# Patient Record
Sex: Male | Born: 2000 | Race: Black or African American | Hispanic: No | Smoking: Never smoker
Health system: Southern US, Community
[De-identification: ages and names within clinical notes are randomized; demographics above are authoritative.]

## PROBLEM LIST (undated history)

## (undated) DIAGNOSIS — J45909 Unspecified asthma, uncomplicated: Secondary | ICD-10-CM

---
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
## 2018-01-07 ENCOUNTER — Emergency Department (HOSPITAL_COMMUNITY): Payer: Medicaid Other

## 2018-01-07 ENCOUNTER — Other Ambulatory Visit: Payer: Self-pay

## 2018-01-07 ENCOUNTER — Emergency Department (HOSPITAL_COMMUNITY)
Admission: EM | Admit: 2018-01-07 | Discharge: 2018-01-07 | Disposition: A | Discharge status: Home / Self Care | Payer: Medicaid Other | Attending: Physician Assistant | Admitting: Physician Assistant

## 2018-01-07 ENCOUNTER — Encounter (HOSPITAL_COMMUNITY): Payer: Self-pay | Admitting: Emergency Medicine

## 2018-01-07 DIAGNOSIS — R1031 Right lower quadrant pain: Secondary | ICD-10-CM | POA: Insufficient documentation

## 2018-01-07 DIAGNOSIS — R1110 Vomiting, unspecified: Secondary | ICD-10-CM | POA: Diagnosis not present

## 2018-01-07 DIAGNOSIS — R109 Unspecified abdominal pain: Secondary | ICD-10-CM

## 2018-01-07 LAB — URINALYSIS, ROUTINE W REFLEX MICROSCOPIC
BILIRUBIN URINE: NEGATIVE
Bacteria, UA: NONE SEEN
GLUCOSE, UA: NEGATIVE mg/dL
KETONES UR: NEGATIVE mg/dL
LEUKOCYTES UA: NEGATIVE
NITRITE: NEGATIVE
PROTEIN: NEGATIVE mg/dL
Specific Gravity, Urine: 1.019 (ref 1.005–1.030)
pH: 5 (ref 5.0–8.0)

## 2018-01-07 LAB — COMPREHENSIVE METABOLIC PANEL
ALBUMIN: 4.1 g/dL (ref 3.5–5.0)
ALT: 39 U/L (ref 17–63)
ANION GAP: 10 (ref 5–15)
AST: 32 U/L (ref 15–41)
Alkaline Phosphatase: 92 U/L (ref 38–126)
BUN: 10 mg/dL (ref 6–20)
CHLORIDE: 106 mmol/L (ref 101–111)
CO2: 24 mmol/L (ref 22–32)
Calcium: 9.4 mg/dL (ref 8.9–10.3)
Creatinine, Ser: 1.06 mg/dL (ref 0.61–1.24)
GFR calc Af Amer: >60 mL/min (ref >60)
Glucose, Bld: 120 mg/dL — ABNORMAL HIGH (ref 65–99)
POTASSIUM: 3.5 mmol/L (ref 3.5–5.1)
Sodium: 140 mmol/L (ref 135–145)
TOTAL PROTEIN: 7.6 g/dL (ref 6.5–8.1)
Total Bilirubin: 0.6 mg/dL (ref 0.3–1.2)

## 2018-01-07 LAB — CBC
HEMATOCRIT: 40.7 % (ref 39.0–52.0)
HEMOGLOBIN: 14.1 g/dL (ref 13.0–17.0)
MCH: 30.9 pg (ref 26.0–34.0)
MCHC: 34.6 g/dL (ref 30.0–36.0)
MCV: 89.3 fL (ref 78.0–100.0)
Platelets: 332 10*3/uL (ref 150–400)
RBC: 4.56 MIL/uL (ref 4.22–5.81)
RDW: 12.2 % (ref 11.5–15.5)
WBC: 8.5 10*3/uL (ref 4.0–10.5)

## 2018-01-07 LAB — LIPASE, BLOOD: LIPASE: 20 U/L (ref 11–51)

## 2018-01-07 MED ORDER — IOPAMIDOL (ISOVUE-300) INJECTION 61%
INTRAVENOUS | Status: AC
  Administered 2018-01-07: 100 mL
  Filled 2018-01-07: qty 100

## 2018-01-07 MED ORDER — IBUPROFEN 800 MG PO TABS: 800.0000 mg | ORAL_TABLET | Freq: Three times a day (TID) | ORAL | 0 refills | Status: DC | PRN

## 2018-01-07 MED ORDER — DICYCLOMINE HCL 20 MG PO TABS: 20.0000 mg | ORAL_TABLET | Freq: Three times a day (TID) | ORAL | 0 refills | Status: DC | PRN

## 2018-01-07 MED ORDER — KETOROLAC TROMETHAMINE 15 MG/ML IJ SOLN
15.0000 mg | Freq: Once | INTRAMUSCULAR | Status: AC
  Administered 2018-01-07: 15 mg via INTRAVENOUS
  Filled 2018-01-07: qty 1

## 2018-01-07 MED ORDER — FENTANYL CITRATE (PF) 100 MCG/2ML IJ SOLN
50.0000 ug | Freq: Once | INTRAMUSCULAR | Status: AC
  Administered 2018-01-07: 50 ug via INTRAVENOUS
  Filled 2018-01-07: qty 2

## 2018-01-07 NOTE — ED Triage Notes (Signed)
Patient woke up this morning with right lateral abdominal pain with emesis x1 , no diarrhea , pt. added nasal congestion/sneezing and rhinorrhea x several days .

## 2018-01-07 NOTE — ED Provider Notes (Signed)
[facility] EMERGENCY DEPARTMENT Provider Note   CSN: [id number] Arrival date & time: 01/07/18  0536     History   Chief Complaint Chief Complaint  Patient presents with  . Abdominal Pain    Emesis  . Nasal Congestion    HPI [name] is a 17 y.o. male that significant past medical history who presents the emergency department complaining of right lower quadrant abdominal pain that woke him from sleep this morning at 0500.  Describes pain as a persistent throbbing in the right lower quadrant, it does not radiate.  States he had one episode of emesis this morning which was nonbloody, no nausea at present.  Patient states the pain has been constant, somewhat improved at this time.  Rates current pain a 5 out of 10 in severity, relayed it is worse with movement, walking, and with going over bumps in the car.  There are no alleviating factors, patient has not tried any interventions prior to arrival.  Denies fever, chills, diarrhea, constipation, blood in stool, dysuria, or testicular pain/swelling.  HPI  History reviewed. No pertinent past medical history.  There are no active problems to display for this patient.   History reviewed. No pertinent surgical history.     Home Medications    Prior to Admission medications   Medication Sig Start Date End Date Taking? Authorizing Provider  acetaminophen (TYLENOL) 325 MG tablet Take 650 mg by mouth every 6 (six) hours as needed for mild pain.     [provider]    Family History History reviewed. No pertinent family history.  Social History Social History   Tobacco Use  . Smoking status: Never Smoker  Substance Use Topics  . Alcohol use: No  . Drug use: No     Allergies   Patient has no known allergies.   Review of Systems Review of Systems  Constitutional: Negative for chills and fever.  Respiratory: Negative for shortness of breath.   Cardiovascular: Negative for chest pain.    Gastrointestinal: Positive for abdominal pain, nausea (resolved) and vomiting (1 episode). Negative for blood in stool, constipation and diarrhea.  Genitourinary: Negative for dysuria, frequency, hematuria, scrotal swelling and testicular pain.  All other systems reviewed and are negative.    Physical Exam Updated Vital Signs BP 106/74 (BP Location: Right Arm)   Pulse 65   Temp 98.1 F (36.7 C) (Oral)   Resp 16   Ht 6\' 1"  (1.854 m)   Wt 113.4 kg (250 lb)   SpO2 99%   BMI 32.98 kg/m   Physical Exam  Constitutional: He appears well-developed and well-nourished. No distress.  HENT:  Head: Normocephalic and atraumatic.  Eyes: Conjunctivae are normal. Right eye exhibits no discharge. Left eye exhibits no discharge.  Cardiovascular: Normal rate and regular rhythm.  No murmur heard. Pulmonary/Chest: Breath sounds normal. No respiratory distress. He has no wheezes. He has no rales.  Abdominal: Soft. He exhibits no distension. There is tenderness in the right lower quadrant and suprapubic area. There is no rigidity, no rebound, no guarding, no CVA tenderness and negative Murphy's sign.  Neurological: He is alert.  Clear speech.   Skin: Skin is warm and dry. No rash noted.  Psychiatric: He has a normal mood and affect. His behavior is normal.  Nursing note and vitals reviewed.   ED Treatments / Results  Labs Results for orders placed or performed during the hospital encounter of 01/07/18  Lipase, blood  Result Value Ref Range  Lipase 20 11 - 51 U/L  Comprehensive metabolic panel  Result Value Ref Range   Sodium 140 135 - 145 mmol/L   Potassium 3.5 3.5 - 5.1 mmol/L   Chloride 106 101 - 111 mmol/L   CO2 24 22 - 32 mmol/L   Glucose, Bld 120 (H) 65 - 99 mg/dL   BUN 10 6 - 20 mg/dL   Creatinine, Ser 9.141.06 0.61 - 1.24 mg/dL   Calcium 9.4 8.9 - 78.210.3 mg/dL   Total Protein 7.6 6.5 - 8.1 g/dL   Albumin 4.1 3.5 - 5.0 g/dL   AST 32 15 - 41 U/L   ALT 39 17 - 63 U/L   Alkaline  Phosphatase 92 38 - 126 U/L   Total Bilirubin 0.6 0.3 - 1.2 mg/dL   GFR calc non Af Amer >60 >60 mL/min   GFR calc Af Amer >60 >60 mL/min   Anion gap 10 5 - 15  CBC  Result Value Ref Range   WBC 8.5 4.0 - 10.5 K/uL   RBC 4.56 4.22 - 5.81 MIL/uL   Hemoglobin 14.1 13.0 - 17.0 g/dL   HCT 95.640.7 21.339.0 - 08.652.0 %   MCV 89.3 78.0 - 100.0 fL   MCH 30.9 26.0 - 34.0 pg   MCHC 34.6 30.0 - 36.0 g/dL   RDW 57.812.2 46.911.5 - 62.915.5 %   Platelets 332 150 - 400 K/uL  Urinalysis, Routine w reflex microscopic  Result Value Ref Range   Color, Urine YELLOW YELLOW   APPearance HAZY (A) CLEAR   Specific Gravity, Urine 1.019 1.005 - 1.030   pH 5.0 5.0 - 8.0   Glucose, UA NEGATIVE NEGATIVE mg/dL   Hgb urine dipstick LARGE (A) NEGATIVE   Bilirubin Urine NEGATIVE NEGATIVE   Ketones, ur NEGATIVE NEGATIVE mg/dL   Protein, ur NEGATIVE NEGATIVE mg/dL   Nitrite NEGATIVE NEGATIVE   Leukocytes, UA NEGATIVE NEGATIVE   RBC / HPF TOO NUMEROUS TO COUNT 0 - 5 RBC/hpf   WBC, UA 6-30 0 - 5 WBC/hpf   Bacteria, UA NONE SEEN NONE SEEN   Squamous Epithelial / LPF 0-5 (A) NONE SEEN   Mucus PRESENT    Hyaline Casts, UA PRESENT    No results found. EKG  EKG Interpretation None       Radiology Ct Abdomen Pelvis W Contrast  Result Date: 01/07/2018 CLINICAL DATA:  Right lateral abdominal pain and emesis. EXAM: CT ABDOMEN AND PELVIS WITH CONTRAST TECHNIQUE: Multidetector CT imaging of the abdomen and pelvis was performed using the standard protocol following bolus administration of intravenous contrast. CONTRAST:  100mL ISOVUE-300 IOPAMIDOL (ISOVUE-300) INJECTION 61% COMPARISON:  None FINDINGS: Lower chest: No acute abnormality. Hepatobiliary: No focal liver abnormality is seen. No gallstones, gallbladder wall thickening, or biliary dilatation. Pancreas: Unremarkable. No pancreatic ductal dilatation or surrounding inflammatory changes. Spleen: Normal in size without focal abnormality. Adrenals/Urinary Tract: The adrenal glands  appear normal. Unremarkable appearance of both kidneys. No mass or hydronephrosis. The urinary bladder appears normal. Stomach/Bowel: The stomach and the small bowel loops have a normal course and caliber. No obstruction. The appendix is visualized and appears normal. Unremarkable appearance of the colon. Vascular/Lymphatic: No significant vascular findings are present. No enlarged abdominal or pelvic lymph nodes. Reproductive: Prostate is unremarkable. Other: No abdominal wall hernia or abnormality. No abdominopelvic ascites. Musculoskeletal: No acute or significant osseous findings. IMPRESSION: 1. No acute findings within the abdomen or pelvis.  Normal exam. Electronically Signed   By: Signa Kellaylor  Stroud M.D.   On: 01/07/2018  09:41    Procedures Procedures (including critical care time)  Medications Ordered in ED Medications  ketorolac (TORADOL) 15 MG/ML injection 15 mg (not administered)  fentaNYL (SUBLIMAZE) injection 50 mcg (50 mcg Intravenous Given 01/07/18 0831)  iopamidol (ISOVUE-300) 61 % injection (100 mLs  Contrast Given 01/07/18 4098)     Initial Impression / Assessment and Plan / ED Course  I have reviewed the triage vital signs and the nursing notes.  Pertinent labs & imaging results that were available during my care of the patient were reviewed by me and considered in my medical decision making (see chart for details).   Patient presents with RLQ abdominal pain. Patient is nontoxic appearing, in no apparent distress, vitals WNL. On exam patient is tender to palpation in the RLQ and periumbilical region. Will evaluate with screening labs and CT abdomen/pelvis.   Lab work grossly unremarkable, of note there is no leukocytosis, anemia, significant electrolyte abnormality, or signs of infections on UA.  UA with hemoglobin with TNTC red blood cells. CT abdomen/pelvis without acute findings. Patient does not meet the SIRS or Sepsis criteria.  On repeat exam patient does not have a surgical  abdomen and there are no peritoneal signs.  No indication of appendicitis, bowel obstruction, bowel perforation, cholecystitis, diverticulitis, or pancreatitis. Considering nephrolithiasis given hematuria on UA, cannot rule at at this time- discussed possibility with patient.    09:54: RE-EVAL: Patient feeling somewhat improved. Rates pain a 4/10. Discussed results. Will administer toradol and PO challenge.   10:25: RE-EVAL: Patient tolerating PO, pain improved. Will DC with Ibuprofen and bentyl for pain.   I discussed results, treatment plan, need for PCP follow-up, and return precautions with the patient and his mother. Provided opportunity for questions, patient and his mother confirmed understanding and are in agreement with plan.   Findings and plan of care discussed with supervising physician Dr. Corlis Leak who evaluated patient and is in agreement with plan.   Final Clinical Impressions(s) / ED Diagnoses   Final diagnoses:  Abdominal pain, unspecified abdominal location    ED Discharge Orders        Ordered    ibuprofen (ADVIL,MOTRIN) 800 MG tablet  Every 8 hours PRN     01/07/18 1028    dicyclomine (BENTYL) 20 MG tablet  Every 8 hours PRN     01/07/18 [address] [place] R, PA-C 01/07/18 1043    Abelino Derrick, MD 01/09/18 [phone]

## 2018-01-07 NOTE — ED Notes (Signed)
Patient transported to CT 

## 2018-01-07 NOTE — Discharge Instructions (Signed)
You were seen in the emergency department today for abdominal pain.  Your CT did not show any acute findings.  Your lab work was fairly normal, there was some blood in your urine which may or may not indicate a kidney stone. You do not have appendicitis.   We have given you 2 prescriptions today: -Ibuprofen 800 mg- take this every 8 hours as needed for pain - Bentyl 20 mg- take this every 8 hours as needed for pain- this is an antispasmodic medication  Follow up with your primary care doctor in the next 2-3 days for reevaluation of your symptoms.  Return to the emergency department for new or worsening symptoms including but not limited to fever, inability to keep down fluids, blood in your stool, worsening pain or any other concerns you may have.

## 2019-10-01 ENCOUNTER — Encounter (HOSPITAL_COMMUNITY): Payer: Self-pay

## 2019-10-01 ENCOUNTER — Other Ambulatory Visit: Payer: Self-pay

## 2019-10-01 ENCOUNTER — Ambulatory Visit (INDEPENDENT_AMBULATORY_CARE_PROVIDER_SITE_OTHER): Payer: Medicaid Other

## 2019-10-01 ENCOUNTER — Ambulatory Visit (HOSPITAL_COMMUNITY)
Admission: EM | Admit: 2019-10-01 | Discharge: 2019-10-01 | Disposition: A | Discharge status: Home / Self Care | Payer: Medicaid Other | Attending: Internal Medicine | Admitting: Internal Medicine

## 2019-10-01 DIAGNOSIS — Z23 Encounter for immunization: Secondary | ICD-10-CM

## 2019-10-01 DIAGNOSIS — S91111A Laceration without foreign body of right great toe without damage to nail, initial encounter: Secondary | ICD-10-CM

## 2019-10-01 DIAGNOSIS — S99921A Unspecified injury of right foot, initial encounter: Secondary | ICD-10-CM

## 2019-10-01 DIAGNOSIS — W298XXA Contact with other powered powered hand tools and household machinery, initial encounter: Secondary | ICD-10-CM | POA: Diagnosis not present

## 2019-10-01 MED ORDER — TETANUS-DIPHTH-ACELL PERTUSSIS 5-2.5-18.5 LF-MCG/0.5 IM SUSP
INTRAMUSCULAR | Status: AC
  Filled 2019-10-01: qty 0.5

## 2019-10-01 MED ORDER — IBUPROFEN 100 MG/5ML PO SUSP: 400.0000 mg | Freq: Three times a day (TID) | ORAL | 0 refills | Status: DC | PRN

## 2019-10-01 MED ORDER — TETANUS-DIPHTH-ACELL PERTUSSIS 5-2.5-18.5 LF-MCG/0.5 IM SUSP
0.5000 mL | Freq: Once | INTRAMUSCULAR | Status: AC
  Administered 2019-10-01: 17:00:00 0.5 mL via INTRAMUSCULAR

## 2019-10-01 NOTE — Discharge Instructions (Signed)
Tetnaus updated Keep foot clean and dry Follow up if developing increased pain, swelling redness or drainage

## 2019-10-01 NOTE — ED Triage Notes (Signed)
Pt states he was sitting down and some how he step on his drill. Pt states he puncture his big toe on his right foot.

## 2019-10-01 NOTE — ED Provider Notes (Signed)
[facility]    CSN: [id number] Arrival date & time: 10/01/19  1542      History   Chief Complaint Chief Complaint  Patient presents with  . Foot Pain    HPI [name] is a 18 y.o. male is significant past medical history presenting today for evaluation of right foot injury.  Patient states that approximately 1 hour ago he accidentally stepped on his power drill drill bit.  This caused a laceration and pain to his right great toe.  He is unsure when his last tetanus was.  Has had a lot of associated pain.  He believes the drill bit was removed fully.  HPI  History reviewed. No pertinent past medical history.  There are no active problems to display for this patient.   History reviewed. No pertinent surgical history.     Home Medications    Prior to Admission medications   Medication Sig Start Date End Date Taking? Authorizing Provider  dicyclomine (BENTYL) 20 MG tablet Take 1 tablet (20 mg total) by mouth every 8 (eight) hours as needed for spasms. 01/07/18   Petrucelli, Pleas Koch, PA-C  guaiFENesin (MUCINEX) 600 MG 12 hr tablet Take 600 mg by mouth 2 (two) times daily as needed for cough.    [provider]  ibuprofen (ADVIL) 100 MG/5ML suspension Take 20-30 mLs (400-600 mg total) by mouth every 8 (eight) hours as needed. 10/01/19   Wieters, Junius Creamer, PA-C    Family History Family History  Problem Relation Age of Onset  . Sickle cell anemia Father     Social History Social History   Tobacco Use  . Smoking status: Never Smoker  . Smokeless tobacco: Never Used  Substance Use Topics  . Alcohol use: No  . Drug use: No     Allergies   Patient has no known allergies.   Review of Systems Review of Systems  Constitutional: Negative for fatigue and fever.  Eyes: Negative for redness, itching and visual disturbance.  Respiratory: Negative for shortness of breath.   Cardiovascular: Negative for chest pain and leg swelling.   Gastrointestinal: Negative for nausea and vomiting.  Musculoskeletal: Positive for arthralgias, gait problem, joint swelling and myalgias.  Skin: Positive for wound. Negative for color change and rash.  Neurological: Negative for dizziness, syncope, weakness, light-headedness and headaches.     Physical Exam Triage Vital Signs ED Triage Vitals  Enc Vitals Group     BP 10/01/19 1604 126/85     Pulse Rate 10/01/19 1604 82     Resp 10/01/19 1604 18     Temp 10/01/19 1604 97.8 F (36.6 C)     Temp Source 10/01/19 1604 Oral     SpO2 10/01/19 1604 97 %     Weight 10/01/19 1606 280 lb (127 kg)     Height --      Head Circumference --      Peak Flow --      Pain Score 10/01/19 1606 10     Pain Loc --      Pain Edu? --      Excl. in GC? --    No data found.  Updated Vital Signs BP 126/85 (BP Location: Right Arm)   Pulse 82   Temp 97.8 F (36.6 C) (Oral)   Resp 18   Wt 280 lb (127 kg)   SpO2 97%   BMI 36.94 kg/m   Visual Acuity Right Eye Distance:   Left Eye Distance:   Bilateral  Distance:    Right Eye Near:   Left Eye Near:    Bilateral Near:     Physical Exam Vitals signs and nursing note reviewed.  Constitutional:      Appearance: He is well-developed.     Comments: No acute distress  HENT:     Head: Normocephalic and atraumatic.     Nose: Nose normal.  Eyes:     Conjunctiva/sclera: Conjunctivae normal.  Neck:     Musculoskeletal: Neck supple.  Cardiovascular:     Rate and Rhythm: Normal rate.  Pulmonary:     Effort: Pulmonary effort is normal. No respiratory distress.  Abdominal:     General: There is no distension.  Musculoskeletal: Normal range of motion.     Comments: Right foot: No obvious swelling or deformity noted to right foot/great toe, nontender to palpation throughout first through fifth metatarsals, dorsalis pedis 2+  Skin:    General: Skin is warm and dry.     Comments: Superficial laceration noted to medial aspect of right great toe as  well as plantar surface at base of great toe, wound edges well reapproximated, minimally deep, bleeding controlled  Neurological:     Mental Status: He is alert and oriented to person, place, and time.      UC Treatments / Results  Labs (all labs ordered are listed, but only abnormal results are displayed) Labs Reviewed - No data to display  EKG   Radiology Dg Foot Complete Right  Result Date: 10/01/2019 CLINICAL DATA:  Stepped on drill bit EXAM: RIGHT FOOT COMPLETE - 3+ VIEW COMPARISON:  None. FINDINGS: There is no evidence of fracture or dislocation. There is no evidence of arthropathy or other focal bone abnormality. Soft tissues are unremarkable. IMPRESSION: Negative. Electronically Signed   By: Donavan Foil M.D.   On: 10/01/2019 17:27    Procedures Laceration Repair  Date/Time: 10/01/2019 7:55 PM Performed by: Wieters, Elesa Hacker, PA-C Authorized by: Chase Picket, MD   Consent:    Consent obtained:  Verbal   Consent given by:  Patient   Risks discussed:  Pain, poor cosmetic result and infection Anesthesia (see MAR for exact dosages):    Anesthesia method:  None Laceration details:    Location:  Toe   Toe location:  R big toe   Length (cm):  0.5   Depth (mm):  1 Repair type:    Repair type:  Simple Exploration:    Hemostasis achieved with:  Direct pressure   Wound exploration: wound explored through full range of motion     Wound extent: no foreign bodies/material noted and no underlying fracture noted     Contaminated: no   Treatment:    Area cleansed with:  Betadine and soap and water   Amount of cleaning:  Standard   Visualized foreign bodies/material removed: no   Skin repair:    Repair method:  Tissue adhesive (Dermabond) Approximation:    Approximation:  Close Post-procedure details:    Dressing:  Non-adherent dressing   Patient tolerance of procedure:  Tolerated well, no immediate complications   (including critical care time)  Medications  Ordered in UC Medications  Tdap (BOOSTRIX) injection 0.5 mL (0.5 mLs Intramuscular Given 10/01/19 1631)  Tdap (BOOSTRIX) 5-2.5-18.5 LF-MCG/0.5 injection (has no administration in time range)    Initial Impression / Assessment and Plan / UC Course  I have reviewed the triage vital signs and the nursing notes.  Pertinent labs & imaging results that were available during my  care of the patient were reviewed by me and considered in my medical decision making (see chart for details).     Lacerations closed with Dermabond for protection.  Provided nonadherent dressing.  Discussed wound care.  Tetanus was updated.  Keep clean and dry, monitor for development of any signs of infection.Discussed strict return precautions. Patient verbalized understanding and is agreeable with plan.  Final Clinical Impressions(s) / UC Diagnoses   Final diagnoses:  Injury of right foot, initial encounter     Discharge Instructions     Tetnaus updated Keep foot clean and dry Follow up if developing increased pain, swelling redness or drainage   ED Prescriptions    Medication Sig Dispense Auth. Provider   ibuprofen (ADVIL) 100 MG/5ML suspension Take 20-30 mLs (400-600 mg total) by mouth every 8 (eight) hours as needed. 473 mL Wieters, Hallie C, PA-C     [place] not [place] encounter.   Lew DawesWieters, Hallie C, [place]PA-C 10/01/19 1956

## 2020-11-08 ENCOUNTER — Other Ambulatory Visit: Payer: Self-pay

## 2020-11-08 ENCOUNTER — Emergency Department (HOSPITAL_COMMUNITY)
Admission: EM | Admit: 2020-11-08 | Discharge: 2020-11-09 | Disposition: A | Discharge status: Home / Self Care | Payer: Worker's Compensation | Attending: Physician Assistant | Admitting: Physician Assistant

## 2020-11-08 ENCOUNTER — Encounter (HOSPITAL_COMMUNITY): Payer: Self-pay | Admitting: Emergency Medicine

## 2020-11-08 ENCOUNTER — Emergency Department (HOSPITAL_COMMUNITY): Payer: Worker's Compensation

## 2020-11-08 DIAGNOSIS — Z5321 Procedure and treatment not carried out due to patient leaving prior to being seen by health care provider: Secondary | ICD-10-CM | POA: Diagnosis not present

## 2020-11-08 DIAGNOSIS — S6991XA Unspecified injury of right wrist, hand and finger(s), initial encounter: Secondary | ICD-10-CM | POA: Diagnosis not present

## 2020-11-08 DIAGNOSIS — W25XXXA Contact with sharp glass, initial encounter: Secondary | ICD-10-CM | POA: Diagnosis not present

## 2020-11-08 DIAGNOSIS — Y990 Civilian activity done for income or pay: Secondary | ICD-10-CM | POA: Diagnosis not present

## 2020-11-08 NOTE — ED Triage Notes (Signed)
Pt reports he cut his right fifth finger on glass today at work. Unknown last tetanus.

## 2020-11-12 ENCOUNTER — Other Ambulatory Visit: Payer: Self-pay

## 2020-11-12 ENCOUNTER — Encounter (HOSPITAL_COMMUNITY): Payer: Self-pay | Admitting: Emergency Medicine

## 2020-11-12 ENCOUNTER — Ambulatory Visit (HOSPITAL_COMMUNITY)
Admission: EM | Admit: 2020-11-12 | Discharge: 2020-11-12 | Disposition: A | Discharge status: Home / Self Care | Payer: Medicaid Other | Attending: Student | Admitting: Student

## 2020-11-12 DIAGNOSIS — J029 Acute pharyngitis, unspecified: Secondary | ICD-10-CM | POA: Insufficient documentation

## 2020-11-12 DIAGNOSIS — U071 COVID-19: Secondary | ICD-10-CM | POA: Diagnosis not present

## 2020-11-12 LAB — POCT RAPID STREP A, ED / UC: Streptococcus, Group A Screen (Direct): NEGATIVE

## 2020-11-12 LAB — RESP PANEL BY RT-PCR (FLU A&B, COVID) ARPGX2
Influenza A by PCR: NEGATIVE
Influenza B by PCR: NEGATIVE
SARS Coronavirus 2 by RT PCR: POSITIVE — AB

## 2020-11-12 MED ORDER — LIDOCAINE VISCOUS HCL 2 % MT SOLN: 15.0000 mL | OROMUCOSAL | 0 refills | Status: DC | PRN

## 2020-11-12 NOTE — Discharge Instructions (Addendum)
-  lidocaine mouthwash for symptomatic relief -tylenol and ibuprofen -we'll call you if any of your labs are positive

## 2020-11-12 NOTE — ED Triage Notes (Signed)
PT C/O: sore throat onset 3-4 days associated headache, dysphagia  DENIES: f/v/n/d  TAKING MEDS: none  A&O x4... NAD... Ambulatory

## 2020-11-12 NOTE — ED Notes (Signed)
Attempted to obtain strep specimen, notified laura, pa of inability to obtain

## 2020-11-12 NOTE — ED Provider Notes (Signed)
[facility]    CSN: [id number] Arrival date & time: 11/12/20  1549      History   Chief Complaint Chief Complaint  Patient presents with  . Sore Throat    HPI [name] is a 20 y.o. male Presenting for URI symptoms for 3 days. Sore throat 6/10 pain, headache, decreased sense of taste.  Denies fevers/chills, n/v/d, shortness of breath, chest pain, cough, congestion, facial pain, teeth pain, loss of taste/smell, swollen lymph nodes, ear pain.  Denies chest pain, shortness of breath, confusion, high fevers.  Not vaccinated for covid-19. Denies worst headache of life, thunderclap headache, weakness/sensation changes in arms/legs, vision changes, shortness of breath, chest pain/pressure, photophobia, phonophobia, n/v/d.    HPI  History reviewed. No pertinent past medical history.  There are no problems to display for this patient.   History reviewed. No pertinent surgical history.     Home Medications    Prior to Admission medications   Medication Sig Start Date End Date Taking? Authorizing Provider  lidocaine (XYLOCAINE) 2 % solution Use as directed 15 mLs in the mouth or throat as needed for mouth pain. 11/12/20  Yes Rhys Martini, PA-C  dicyclomine (BENTYL) 20 MG tablet Take 1 tablet (20 mg total) by mouth every 8 (eight) hours as needed for spasms. 01/07/18   Petrucelli, Pleas Koch, PA-C  guaiFENesin (MUCINEX) 600 MG 12 hr tablet Take 600 mg by mouth 2 (two) times daily as needed for cough.    [provider]  ibuprofen (ADVIL) 100 MG/5ML suspension Take 20-30 mLs (400-600 mg total) by mouth every 8 (eight) hours as needed. 10/01/19   Wieters, Junius Creamer, PA-C    Family History Family History  Problem Relation Age of Onset  . Sickle cell anemia Father     Social History Social History   Tobacco Use  . Smoking status: Never Smoker  . Smokeless tobacco: Never Used  Substance Use Topics  . Alcohol use: No  . Drug use: No     Allergies    Patient has no known allergies.   Review of Systems Review of Systems  Constitutional: Negative for appetite change, chills and fever.  HENT: Positive for sore throat. Negative for congestion, ear pain, rhinorrhea, sinus pressure and sinus pain.   Eyes: Negative for redness and visual disturbance.  Respiratory: Negative for cough, chest tightness, shortness of breath and wheezing.   Cardiovascular: Negative for chest pain and palpitations.  Gastrointestinal: Negative for abdominal pain, constipation, diarrhea, nausea and vomiting.  Genitourinary: Negative for dysuria, frequency and urgency.  Musculoskeletal: Negative for myalgias.  Neurological: Positive for headaches. Negative for dizziness and weakness.  Psychiatric/Behavioral: Negative for confusion.  All other systems reviewed and are negative.    Physical Exam Triage Vital Signs ED Triage Vitals  Enc Vitals Group     BP 11/12/20 1732 114/74     Pulse Rate 11/12/20 1732 81     Resp 11/12/20 1732 16     Temp 11/12/20 1732 98.4 F (36.9 C)     Temp Source 11/12/20 1732 Oral     SpO2 11/12/20 1732 99 %     Weight --      Height --      Head Circumference --      Peak Flow --      Pain Score 11/12/20 1731 7     Pain Loc --      Pain Edu? --      Excl. in GC? --  No data found.  Updated Vital Signs BP 114/74 (BP Location: Right Arm)   Pulse 81   Temp 98.4 F (36.9 C) (Oral)   Resp 16   SpO2 99%   Visual Acuity Right Eye Distance:   Left Eye Distance:   Bilateral Distance:    Right Eye Near:   Left Eye Near:    Bilateral Near:     Physical Exam Vitals reviewed.  Constitutional:      General: He is not in acute distress.    Appearance: Normal appearance. He is not ill-appearing.  HENT:     Head: Normocephalic and atraumatic.     Right Ear: Hearing, tympanic membrane, ear canal and external ear normal. No swelling or tenderness. There is no impacted cerumen. No mastoid tenderness. Tympanic membrane is  not perforated, erythematous, retracted or bulging.     Left Ear: Hearing, tympanic membrane, ear canal and external ear normal. No swelling or tenderness. There is no impacted cerumen. No mastoid tenderness. Tympanic membrane is not perforated, erythematous, retracted or bulging.     Nose:     Right Sinus: No maxillary sinus tenderness or frontal sinus tenderness.     Left Sinus: No maxillary sinus tenderness or frontal sinus tenderness.     Mouth/Throat:     Mouth: Mucous membranes are moist.     Pharynx: Uvula midline. Posterior oropharyngeal erythema present. No oropharyngeal exudate.     Tonsils: No tonsillar exudate.  Cardiovascular:     Rate and Rhythm: Normal rate and regular rhythm.     Heart sounds: Normal heart sounds.  Pulmonary:     Breath sounds: Normal breath sounds and air entry. No wheezing, rhonchi or rales.  Chest:     Chest wall: No tenderness.  Abdominal:     General: Abdomen is flat. Bowel sounds are normal.     Tenderness: There is no abdominal tenderness. There is no guarding or rebound.  Lymphadenopathy:     Cervical: No cervical adenopathy.  Neurological:     General: No focal deficit present.     Mental Status: He is alert and oriented to person, place, and time.  Psychiatric:        Attention and Perception: Attention and perception normal.        Mood and Affect: Mood and affect normal.        Behavior: Behavior normal. Behavior is cooperative.        Thought Content: Thought content normal.        Judgment: Judgment normal.      UC Treatments / Results  Labs (all labs ordered are listed, but only abnormal results are displayed) Labs Reviewed  RESP PANEL BY RT-PCR (FLU A&B, COVID) ARPGX2 - Abnormal; Notable for the following components:      Result Value   SARS Coronavirus 2 by RT PCR POSITIVE (*)    All other components within normal limits  CULTURE, GROUP A STREP [facility])  POCT RAPID STREP A, ED / UC    EKG   Radiology No results  found.  Procedures Procedures (including critical care time)  Medications Ordered in UC Medications - No data to display  Initial Impression / Assessment and Plan / UC Course  I have reviewed the triage vital signs and the nursing notes.  Pertinent labs & imaging results that were available during my care of the patient were reviewed by me and considered in my medical decision making (see chart for details).     Covid and  influenza tests sent today- positive for covid19. Isolation precautions per CDC guidelines. Symptomatic relief with OTC Mucinex, Nyquil, etc. Return precautions- new/worsening fevers/chills, shortness of breath, chest pain, abd pain, etc.   Rapid strep negative today. Culture sent.   Symptomatic relief with lidocaine mouthwash.   -For fevers/chills, body aches, headaches- use Tylenol and Ibuprofen. You can alternate these for maximum effect. Use up to 3000mg  Tylenol daily and 3200mg  Ibuprofen daily. Make sure to take ibuprofen with food. Check the bottle of ibuprofen/tylenol for specific dosage instructions. -For sore throat, use lidocaine mouthwash up to every 4 hours. Make sure not to eat for at least 1 hour after using this, as your mouth will be very numb and you could bite yourself.   Final Clinical Impressions(s) / UC Diagnoses   Final diagnoses:  Viral pharyngitis     Discharge Instructions     -lidocaine mouthwash for symptomatic relief -tylenol and ibuprofen -we'll call you if any of your labs are positive    ED Prescriptions    Medication Sig Dispense Auth. Provider   lidocaine (XYLOCAINE) 2 % solution Use as directed 15 mLs in the mouth or throat as needed for mouth pain. 100 mL Hazel Sams, PA-C     PDMP not reviewed this encounter.   Hazel Sams, PA-C 11/13/20 [phone]

## 2020-11-14 LAB — CULTURE, GROUP A STREP (THRC)

## 2020-11-15 LAB — CULTURE, GROUP A STREP (THRC)

## 2021-07-21 ENCOUNTER — Other Ambulatory Visit: Payer: Self-pay | Admitting: Internal Medicine

## 2021-07-22 LAB — COMPLETE METABOLIC PANEL WITH GFR
AG Ratio: 1.7 (calc) (ref 1.0–2.5)
ALT: 20 U/L (ref 9–46)
AST: 27 U/L (ref 10–40)
Albumin: 4.6 g/dL (ref 3.6–5.1)
Alkaline phosphatase (APISO): 107 U/L (ref 36–130)
BUN: 10 mg/dL (ref 7–25)
CO2: 27 mmol/L (ref 20–32)
Calcium: 10.3 mg/dL (ref 8.6–10.3)
Chloride: 105 mmol/L (ref 98–110)
Creat: 0.97 mg/dL (ref 0.60–1.24)
Globulin: 2.7 g/dL (calc) (ref 1.9–3.7)
Glucose, Bld: 75 mg/dL (ref 65–99)
Potassium: 4.3 mmol/L (ref 3.5–5.3)
Sodium: 142 mmol/L (ref 135–146)
Total Bilirubin: 0.5 mg/dL (ref 0.2–1.2)
Total Protein: 7.3 g/dL (ref 6.1–8.1)
eGFR: 113 mL/min/{1.73_m2} (ref >=60)

## 2021-07-22 LAB — CBC
HCT: 43.6 % (ref 38.5–50.0)
Hemoglobin: 14.7 g/dL (ref 13.2–17.1)
MCH: 29.7 pg (ref 27.0–33.0)
MCHC: 33.7 g/dL (ref 32.0–36.0)
MCV: 88.1 fL (ref 80.0–100.0)
MPV: 10.9 fL (ref 7.5–12.5)
Platelets: 339 10*3/uL (ref 140–400)
RBC: 4.95 10*6/uL (ref 4.20–5.80)
RDW: 11.9 % (ref 11.0–15.0)
WBC: 7 10*3/uL (ref 3.8–10.8)

## 2021-07-22 LAB — VITAMIN D 25 HYDROXY (VIT D DEFICIENCY, FRACTURES): Vit D, 25-Hydroxy: 14 ng/mL — ABNORMAL LOW (ref 30–100)

## 2021-07-22 LAB — LIPID PANEL
Cholesterol: 132 mg/dL (ref <200)
HDL: 31 mg/dL — ABNORMAL LOW (ref >=40)
LDL Cholesterol (Calc): 76 mg/dL (calc)
Non-HDL Cholesterol (Calc): 101 mg/dL (calc) (ref <130)
Total CHOL/HDL Ratio: 4.3 (calc) (ref <5.0)
Triglycerides: 148 mg/dL (ref <150)

## 2022-06-02 ENCOUNTER — Encounter: Payer: Self-pay | Admitting: Emergency Medicine

## 2022-06-02 ENCOUNTER — Emergency Department (HOSPITAL_COMMUNITY): Payer: Medicaid Other

## 2022-06-02 ENCOUNTER — Encounter (HOSPITAL_COMMUNITY): Payer: Self-pay

## 2022-06-02 ENCOUNTER — Emergency Department (HOSPITAL_COMMUNITY)
Admission: EM | Admit: 2022-06-02 | Discharge: 2022-06-02 | Disposition: A | Discharge status: Home / Self Care | Payer: Medicaid Other | Attending: Emergency Medicine | Admitting: Emergency Medicine

## 2022-06-02 ENCOUNTER — Ambulatory Visit
Admission: EM | Admit: 2022-06-02 | Discharge: 2022-06-02 | Disposition: A | Discharge status: Home / Self Care | Payer: Medicaid Other

## 2022-06-02 ENCOUNTER — Other Ambulatory Visit: Payer: Self-pay

## 2022-06-02 DIAGNOSIS — R300 Dysuria: Secondary | ICD-10-CM | POA: Diagnosis not present

## 2022-06-02 DIAGNOSIS — R112 Nausea with vomiting, unspecified: Secondary | ICD-10-CM

## 2022-06-02 DIAGNOSIS — R1031 Right lower quadrant pain: Secondary | ICD-10-CM

## 2022-06-02 LAB — CBC WITH DIFFERENTIAL/PLATELET
Abs Immature Granulocytes: 0.03 10*3/uL (ref 0.00–0.07)
Basophils Absolute: 0.1 10*3/uL (ref 0.0–0.1)
Basophils Relative: 1 %
Eosinophils Absolute: 0.2 10*3/uL (ref 0.0–0.5)
Eosinophils Relative: 3 %
HCT: 44.1 % (ref 39.0–52.0)
Hemoglobin: 14.9 g/dL (ref 13.0–17.0)
Immature Granulocytes: 0 %
Lymphocytes Relative: 13 %
Lymphs Abs: 0.9 10*3/uL (ref 0.7–4.0)
MCH: 29.8 pg (ref 26.0–34.0)
MCHC: 33.8 g/dL (ref 30.0–36.0)
MCV: 88.2 fL (ref 80.0–100.0)
Monocytes Absolute: 0.3 10*3/uL (ref 0.1–1.0)
Monocytes Relative: 5 %
Neutro Abs: 5.8 10*3/uL (ref 1.7–7.7)
Neutrophils Relative %: 78 %
Platelets: 302 10*3/uL (ref 150–400)
RBC: 5 MIL/uL (ref 4.22–5.81)
RDW: 11.6 % (ref 11.5–15.5)
WBC: 7.3 10*3/uL (ref 4.0–10.5)
nRBC: 0 % (ref 0.0–0.2)

## 2022-06-02 LAB — COMPREHENSIVE METABOLIC PANEL
ALT: 19 U/L (ref 0–44)
AST: 22 U/L (ref 15–41)
Albumin: 4.1 g/dL (ref 3.5–5.0)
Alkaline Phosphatase: 95 U/L (ref 38–126)
Anion gap: 9 (ref 5–15)
BUN: 9 mg/dL (ref 6–20)
CO2: 28 mmol/L (ref 22–32)
Calcium: 9.9 mg/dL (ref 8.9–10.3)
Chloride: 105 mmol/L (ref 98–111)
Creatinine, Ser: 1.15 mg/dL (ref 0.61–1.24)
GFR, Estimated: >60 mL/min (ref >60)
Glucose, Bld: 110 mg/dL — ABNORMAL HIGH (ref 70–99)
Potassium: 4.1 mmol/L (ref 3.5–5.1)
Sodium: 142 mmol/L (ref 135–145)
Total Bilirubin: 0.6 mg/dL (ref 0.3–1.2)
Total Protein: 7.7 g/dL (ref 6.5–8.1)

## 2022-06-02 LAB — URINALYSIS, ROUTINE W REFLEX MICROSCOPIC
Bacteria, UA: NONE SEEN
Bilirubin Urine: NEGATIVE
Glucose, UA: NEGATIVE mg/dL
Ketones, ur: NEGATIVE mg/dL
Leukocytes,Ua: NEGATIVE
Nitrite: NEGATIVE
Protein, ur: NEGATIVE mg/dL
Specific Gravity, Urine: 1.012 (ref 1.005–1.030)
pH: 5 (ref 5.0–8.0)

## 2022-06-02 MED ORDER — SODIUM CHLORIDE (PF) 0.9 % IJ SOLN
INTRAMUSCULAR | Status: AC
  Filled 2022-06-02: qty 50

## 2022-06-02 MED ORDER — IOHEXOL 300 MG/ML  SOLN
100.0000 mL | Freq: Once | INTRAMUSCULAR | Status: AC | PRN
  Administered 2022-06-02: 100 mL via INTRAVENOUS

## 2022-06-02 NOTE — Discharge Instructions (Signed)
There were no serious problems found.  For pain use acetaminophen 650 mg every 4 hours.  Rest is much as possible.  Gradually advance your diet.  See your doctor if not better in a few days.

## 2022-06-02 NOTE — ED Provider Notes (Signed)
[facility]EMERGENCY DEPT Provider Note   CSN: [id number] Arrival date & time: 06/02/22  1125     History  Chief Complaint  Patient presents with   Flank Pain   Dysuria    [name] is a 21 y.o. male.  HPI Patient presents for evaluation of right lower quadrant abdominal pain that started at 7 AM this morning.  Since then he has noticed some dysuria when he urinates.  He has chronic dysuria for 2 years, that occurs 3-4 times a week.  He denies fever, chills, cough or shortness of breath.  He was seen at an urgent care earlier, and sent here for evaluation.    Home Medications Prior to Admission medications   Medication Sig Start Date End Date Taking? Authorizing Provider  dicyclomine (BENTYL) 20 MG tablet Take 1 tablet (20 mg total) by mouth every 8 (eight) hours as needed for spasms. 01/07/18   Petrucelli, Pleas Koch, PA-C  guaiFENesin (MUCINEX) 600 MG 12 hr tablet Take 600 mg by mouth 2 (two) times daily as needed for cough.    [provider]  ibuprofen (ADVIL) 100 MG/5ML suspension Take 20-30 mLs (400-600 mg total) by mouth every 8 (eight) hours as needed. 10/01/19   Wieters, Hallie C, PA-C  lidocaine (XYLOCAINE) 2 % solution Use as directed 15 mLs in the mouth or throat as needed for mouth pain. 11/12/20   Rhys Martini, PA-C      Allergies    Patient has no known allergies.    Review of Systems   Review of Systems  Physical Exam Updated Vital Signs BP (!) 150/79 (BP Location: Left Arm)   Pulse (!) 53   Temp 98.1 F (36.7 C) (Oral)   Resp 17   Ht 6\' 1"  (1.854 m)   Wt 116.1 kg   SpO2 98%   BMI 33.78 kg/m  Physical Exam Vitals and nursing note reviewed.  Constitutional:      General: He is not in acute distress.    Appearance: He is well-developed. He is not ill-appearing.  HENT:     Head: Normocephalic and atraumatic.     Right Ear: External ear normal.     Left Ear: External ear normal.  Eyes:     Conjunctiva/sclera:  Conjunctivae normal.     Pupils: Pupils are equal, round, and reactive to light.  Neck:     Trachea: Phonation normal.  Cardiovascular:     Rate and Rhythm: Normal rate.     Heart sounds: Normal heart sounds.  Pulmonary:     Effort: Pulmonary effort is normal.  Abdominal:     General: There is no distension.     Palpations: There is no mass.     Tenderness: There is abdominal tenderness (Right lower quadrant, moderate). There is no guarding.     Hernia: No hernia is present.  Genitourinary:    Penis: Normal.      Testes: Normal.  Musculoskeletal:        General: Normal range of motion.     Cervical back: Normal range of motion and neck supple.  Skin:    General: Skin is warm and dry.  Neurological:     Mental Status: He is alert and oriented to person, place, and time.     Cranial Nerves: No cranial nerve deficit.     Sensory: No sensory deficit.     Motor: No abnormal muscle tone.     Coordination: Coordination normal.  Psychiatric:  Mood and Affect: Mood normal.        Behavior: Behavior normal.        Thought Content: Thought content normal.        Judgment: Judgment normal.     ED Results / Procedures / Treatments   Labs (all labs ordered are listed, but only abnormal results are displayed) Labs Reviewed  URINALYSIS, ROUTINE W REFLEX MICROSCOPIC - Abnormal; Notable for the following components:      Result Value   Hgb urine dipstick MODERATE (*)    All other components within normal limits  COMPREHENSIVE METABOLIC PANEL - Abnormal; Notable for the following components:   Glucose, Bld 110 (*)    All other components within normal limits  CBC WITH DIFFERENTIAL/PLATELET    EKG None  Radiology CT Abdomen Pelvis W Contrast  Result Date: 06/02/2022 CLINICAL DATA:  Right lower quadrant abdominal pain. EXAM: CT ABDOMEN AND PELVIS WITH CONTRAST TECHNIQUE: Multidetector CT imaging of the abdomen and pelvis was performed using the standard protocol following  bolus administration of intravenous contrast. RADIATION DOSE REDUCTION: This exam was performed according to the departmental dose-optimization program which includes automated exposure control, adjustment of the mA and/or kV according to patient size and/or use of iterative reconstruction technique. CONTRAST:  OMNIPAQUE IOHEXOL 300 MG/ML  SOLN COMPARISON:  None Available. FINDINGS: Lower chest: No acute abnormality. Hepatobiliary: No focal liver abnormality is seen. No gallstones, gallbladder wall thickening, or biliary dilatation. Pancreas: Unremarkable. No pancreatic ductal dilatation or surrounding inflammatory changes. Spleen: Normal in size without focal abnormality. Adrenals/Urinary Tract: Adrenal glands are unremarkable. Kidneys are normal, without renal calculi, focal lesion, or hydronephrosis. Bladder is unremarkable. Stomach/Bowel: Stomach is within normal limits. Appendix appears normal. No evidence of bowel wall thickening, distention, or inflammatory changes. Vascular/Lymphatic: No significant vascular findings are present. No enlarged abdominal or pelvic lymph nodes. Reproductive: Prostate is unremarkable. Other: No abdominal wall hernia or abnormality. No abdominopelvic ascites. Musculoskeletal: No acute or significant osseous findings. IMPRESSION: 1.  Normal appendix. 2. Bowel loops are normal in caliber. No evidence of obstruction, colitis or diverticulitis. No significant stool burden to suggest constipation. 3.  No evidence of nephrolithiasis or hydronephrosis. 4.  No CT evidence of acute abdominal/pelvic process. Electronically Signed   By: Larose Hires D.O.   On: 06/02/2022 14:57    Procedures Procedures    Medications Ordered in ED Medications  sodium chloride (PF) 0.9 % injection (  Given by Other 06/02/22 1431)  iohexol (OMNIPAQUE) 300 MG/ML solution 100 mL (100 mLs Intravenous Contrast Given 06/02/22 1435)    ED Course/ Medical Decision Making/ A&P                            Medical Decision Making Patient with right lower quadrant abdominal pain, and dysuria.  Differential diagnosis includes appendicitis and urinary tract infection.  Will order screening labs and CT imaging.  Amount and/or Complexity of Data Reviewed Independent Historian:     Details: He is a cogent historian Labs: ordered.    Details: CBC, metabolic panel, urinalysis -- normal except for moderate hemoglobin on urinalysis. Radiology: ordered and independent interpretation performed.    Details: CT abdomen pelvis, with contrast -- no evidence for bowel obstruction, appendicitis, urinary tract stones or other acute intra-abdominal abnormality.  Risk Prescription drug management. Decision regarding hospitalization. Risk Details: Patient presenting with abdominal pain acute in onset, moderate to severe.  Imaging did not show any acute abnormalities.  Patient remained comfortable in the ED and did not have exacerbation of pain, or other complaints.  Further ED work-up not necessary.  He does not require hospitalization at this time.           Final Clinical Impression(s) / ED Diagnoses Final diagnoses:  Right lower quadrant abdominal pain    Rx / DC Orders ED Discharge Orders     None         Mancel Bale, MD 06/02/22 [phone]

## 2022-06-02 NOTE — ED Notes (Signed)
Pt A&OX4 ambulatory at d/c with independent steady gait, NAD. Pt verbalized understanding of d/c instructions and follow up care. ?

## 2022-06-02 NOTE — ED Triage Notes (Signed)
Pt c/o right flank pain and dysuria that started today.

## 2022-06-02 NOTE — ED Triage Notes (Signed)
Pt here for abd pain with vomiting x 1; pain on right side; pt sts no BM x 3 days which is not normal for him

## 2022-06-02 NOTE — ED Provider Notes (Signed)
[facility]    CSN: [id number] Arrival date & time: 06/02/22  1043      History   Chief Complaint Chief Complaint  Patient presents with   Abdominal Pain   Constipation    HPI [name] is a 21 y.o. male.   Patient here today for evaluation of right lower quadrant pain that started today.  He reports that pain is a 9 on a 0-10 pain scale and feels as if he has been punched in that right lower quadrant.  He has had 1 episode of vomiting today.  He reports he has not had a bowel movement in 3 days which is not typical for him.  He has not had fever.  He reports before this morning he did not have any other symptoms and denies diarrhea, blood in stool, or nausea or vomiting.  He denies any history of abdominal surgery.  The history is provided by the patient.    History reviewed. No pertinent past medical history.  There are no problems to display for this patient.   History reviewed. No pertinent surgical history.     Home Medications    Prior to Admission medications   Medication Sig Start Date End Date Taking? Authorizing Provider  dicyclomine (BENTYL) 20 MG tablet Take 1 tablet (20 mg total) by mouth every 8 (eight) hours as needed for spasms. 01/07/18   Petrucelli, Pleas Koch, PA-C  guaiFENesin (MUCINEX) 600 MG 12 hr tablet Take 600 mg by mouth 2 (two) times daily as needed for cough.    [provider]  ibuprofen (ADVIL) 100 MG/5ML suspension Take 20-30 mLs (400-600 mg total) by mouth every 8 (eight) hours as needed. 10/01/19   Wieters, Hallie C, PA-C  lidocaine (XYLOCAINE) 2 % solution Use as directed 15 mLs in the mouth or throat as needed for mouth pain. 11/12/20   Rhys Martini, PA-C    Family History Family History  Problem Relation Age of Onset   Sickle cell anemia Father     Social History Social History   Tobacco Use   Smoking status: Never   Smokeless tobacco: Never  Substance Use Topics   Alcohol use: No   Drug use: No      Allergies   Patient has no known allergies.   Review of Systems Review of Systems  Constitutional:  Negative for chills and fever.  Eyes:  Negative for discharge and redness.  Gastrointestinal:  Positive for abdominal pain, constipation, nausea and vomiting. Negative for diarrhea.     Physical Exam Triage Vital Signs ED Triage Vitals  Enc Vitals Group     BP      Pulse      Resp      Temp      Temp src      SpO2      Weight      Height      Head Circumference      Peak Flow      Pain Score      Pain Loc      Pain Edu?      Excl. in GC?    No data found.  Updated Vital Signs BP (!) 150/102 (BP Location: Left Arm)   Pulse 64   Temp (!) 97.3 F (36.3 C) (Oral)   Resp 18   SpO2 95%   Physical Exam Vitals and nursing note reviewed.  Constitutional:      General: He is in acute distress (mild  distress, appears uncomfortable, leaning over, holding right lower abdomen).  HENT:     Head: Normocephalic and atraumatic.  Eyes:     Conjunctiva/sclera: Conjunctivae normal.  Cardiovascular:     Rate and Rhythm: Normal rate and regular rhythm.     Heart sounds: Normal heart sounds. No murmur heard. Pulmonary:     Effort: Pulmonary effort is normal. No respiratory distress.     Breath sounds: Normal breath sounds. No wheezing, rhonchi or rales.  Abdominal:     General: Abdomen is flat. Bowel sounds are normal. There is no distension.     Palpations: Abdomen is soft.     Tenderness: There is abdominal tenderness (TTP note to RLQ). There is guarding. There is no rebound.  Skin:    General: Skin is warm and dry.  Neurological:     Mental Status: He is alert.  Psychiatric:        Mood and Affect: Mood normal.        Behavior: Behavior normal.      UC Treatments / Results  Labs (all labs ordered are listed, but only abnormal results are displayed) Labs Reviewed - No data to display  EKG   Radiology No results found.  Procedures Procedures (including  critical care time)  Medications Ordered in UC Medications - No data to display  Initial Impression / Assessment and Plan / UC Course  I have reviewed the triage vital signs and the nursing notes.  Pertinent labs & imaging results that were available during my care of the patient were reviewed by me and considered in my medical decision making (see chart for details).    Discussed with patient my concerns for possible acute appendicitis, and recommended further evaluation in the emergency department.  Patient is agreeable with same and mother will drive him to [place] immediately after leaving our office.  Final Clinical Impressions(s) / UC Diagnoses   Final diagnoses:  RLQ abdominal pain  Nausea and vomiting, unspecified vomiting type   Discharge Instructions   None    ED Prescriptions   None    PDMP not reviewed this encounter.   Tomi Bamberger, PA-C 06/02/22 1104

## 2023-11-10 ENCOUNTER — Encounter (HOSPITAL_COMMUNITY): Payer: Self-pay | Admitting: Family Medicine

## 2023-11-10 ENCOUNTER — Emergency Department (HOSPITAL_COMMUNITY): Payer: Medicaid Other

## 2023-11-10 ENCOUNTER — Inpatient Hospital Stay (HOSPITAL_COMMUNITY)
Admission: EM | Admit: 2023-11-10 | Discharge: 2023-11-12 | DRG: 202 | Disposition: A | Discharge status: Home / Self Care | Payer: Medicaid Other | Attending: Family Medicine | Admitting: Family Medicine

## 2023-11-10 DIAGNOSIS — E876 Hypokalemia: Secondary | ICD-10-CM | POA: Diagnosis not present

## 2023-11-10 DIAGNOSIS — F17210 Nicotine dependence, cigarettes, uncomplicated: Secondary | ICD-10-CM | POA: Diagnosis present

## 2023-11-10 DIAGNOSIS — Z832 Family history of diseases of the blood and blood-forming organs and certain disorders involving the immune mechanism: Secondary | ICD-10-CM

## 2023-11-10 DIAGNOSIS — R000 Tachycardia, unspecified: Secondary | ICD-10-CM | POA: Diagnosis present

## 2023-11-10 DIAGNOSIS — Z79899 Other long term (current) drug therapy: Secondary | ICD-10-CM

## 2023-11-10 DIAGNOSIS — J9601 Acute respiratory failure with hypoxia: Secondary | ICD-10-CM | POA: Diagnosis not present

## 2023-11-10 DIAGNOSIS — Z1152 Encounter for screening for COVID-19: Secondary | ICD-10-CM

## 2023-11-10 DIAGNOSIS — F17290 Nicotine dependence, other tobacco product, uncomplicated: Secondary | ICD-10-CM | POA: Diagnosis present

## 2023-11-10 DIAGNOSIS — J069 Acute upper respiratory infection, unspecified: Secondary | ICD-10-CM | POA: Diagnosis present

## 2023-11-10 DIAGNOSIS — I10 Essential (primary) hypertension: Secondary | ICD-10-CM | POA: Diagnosis present

## 2023-11-10 DIAGNOSIS — R0902 Hypoxemia: Secondary | ICD-10-CM

## 2023-11-10 DIAGNOSIS — Q211 Atrial septal defect, unspecified: Secondary | ICD-10-CM

## 2023-11-10 DIAGNOSIS — J45901 Unspecified asthma with (acute) exacerbation: Secondary | ICD-10-CM | POA: Diagnosis not present

## 2023-11-10 DIAGNOSIS — Z720 Tobacco use: Secondary | ICD-10-CM | POA: Diagnosis not present

## 2023-11-10 HISTORY — DX: Unspecified asthma with (acute) exacerbation: J45.901

## 2023-11-10 LAB — RESP PANEL BY RT-PCR (RSV, FLU A&B, COVID)  RVPGX2
Influenza A by PCR: NEGATIVE
Influenza B by PCR: NEGATIVE
Resp Syncytial Virus by PCR: NEGATIVE
SARS Coronavirus 2 by RT PCR: NEGATIVE

## 2023-11-10 LAB — CBC WITH DIFFERENTIAL/PLATELET
Abs Immature Granulocytes: 0.05 10*3/uL (ref 0.00–0.07)
Basophils Absolute: 0.1 10*3/uL (ref 0.0–0.1)
Basophils Relative: 0 %
Eosinophils Absolute: 0.3 10*3/uL (ref 0.0–0.5)
Eosinophils Relative: 2 %
HCT: 43.5 % (ref 39.0–52.0)
Hemoglobin: 15.4 g/dL (ref 13.0–17.0)
Immature Granulocytes: 0 %
Lymphocytes Relative: 3 %
Lymphs Abs: 0.5 10*3/uL — ABNORMAL LOW (ref 0.7–4.0)
MCH: 30.6 pg (ref 26.0–34.0)
MCHC: 35.4 g/dL (ref 30.0–36.0)
MCV: 86.5 fL (ref 80.0–100.0)
Monocytes Absolute: 0.3 10*3/uL (ref 0.1–1.0)
Monocytes Relative: 2 %
Neutro Abs: 15.1 10*3/uL — ABNORMAL HIGH (ref 1.7–7.7)
Neutrophils Relative %: 93 %
Platelets: 311 10*3/uL (ref 150–400)
RBC: 5.03 MIL/uL (ref 4.22–5.81)
RDW: 11.7 % (ref 11.5–15.5)
WBC: 16.2 10*3/uL — ABNORMAL HIGH (ref 4.0–10.5)
nRBC: 0 % (ref 0.0–0.2)

## 2023-11-10 LAB — BASIC METABOLIC PANEL
Anion gap: 10 (ref 5–15)
BUN: 7 mg/dL (ref 6–20)
CO2: 23 mmol/L (ref 22–32)
Calcium: 9.1 mg/dL (ref 8.9–10.3)
Chloride: 104 mmol/L (ref 98–111)
Creatinine, Ser: 0.78 mg/dL (ref 0.61–1.24)
GFR, Estimated: >60 mL/min (ref >60)
Glucose, Bld: 128 mg/dL — ABNORMAL HIGH (ref 70–99)
Potassium: 3 mmol/L — ABNORMAL LOW (ref 3.5–5.1)
Sodium: 137 mmol/L (ref 135–145)

## 2023-11-10 MED ORDER — IPRATROPIUM-ALBUTEROL 0.5-2.5 (3) MG/3ML IN SOLN
3.0000 mL | RESPIRATORY_TRACT | Status: AC
  Administered 2023-11-10 (×2): 3 mL via RESPIRATORY_TRACT
  Filled 2023-11-10: qty 6

## 2023-11-10 MED ORDER — ALBUTEROL SULFATE (2.5 MG/3ML) 0.083% IN NEBU
2.5000 mg | INHALATION_SOLUTION | Freq: Once | RESPIRATORY_TRACT | Status: AC
  Administered 2023-11-10: 2.5 mg via RESPIRATORY_TRACT
  Filled 2023-11-10: qty 3

## 2023-11-10 MED ORDER — METHYLPREDNISOLONE SODIUM SUCC 125 MG IJ SOLR
60.0000 mg | Freq: Every day | INTRAMUSCULAR | Status: DC
  Administered 2023-11-11: 60 mg via INTRAVENOUS
  Filled 2023-11-10: qty 2

## 2023-11-10 MED ORDER — ALBUTEROL SULFATE (2.5 MG/3ML) 0.083% IN NEBU: 2.5000 mg | INHALATION_SOLUTION | RESPIRATORY_TRACT | Status: DC | PRN

## 2023-11-10 MED ORDER — POTASSIUM CHLORIDE CRYS ER 20 MEQ PO TBCR
40.0000 meq | EXTENDED_RELEASE_TABLET | Freq: Once | ORAL | Status: AC
Start: 2023-11-10 — End: 2023-11-10
  Administered 2023-11-10: 40 meq via ORAL
  Filled 2023-11-10: qty 2

## 2023-11-10 MED ORDER — ACETAMINOPHEN 325 MG PO TABS
650.0000 mg | ORAL_TABLET | Freq: Four times a day (QID) | ORAL | Status: DC | PRN
  Administered 2023-11-11 – 2023-11-12 (×2): 650 mg via ORAL
  Filled 2023-11-10 (×2): qty 2

## 2023-11-10 MED ORDER — LEVALBUTEROL HCL 1.25 MG/0.5ML IN NEBU
1.2500 mg | INHALATION_SOLUTION | Freq: Four times a day (QID) | RESPIRATORY_TRACT | Status: DC | PRN
  Administered 2023-11-11 (×2): 1.25 mg via RESPIRATORY_TRACT
  Filled 2023-11-10 (×2): qty 0.5

## 2023-11-10 NOTE — ED Triage Notes (Signed)
Per EMS from home. Hx of asthma. SOB since this morning. Albuterol home treatments no affect.   BP 168/100 RR 26 O2 93 on 4L [place] HR 110

## 2023-11-10 NOTE — Assessment & Plan Note (Addendum)
-  secondary to acute asthma exacerbation triggered by viral URI -Now requiring 4L on presentation -continue PRN xopenex nebulizer  -continue IV solu medrol daily -wean O2 as tolerated with goal O2 >92%

## 2023-11-10 NOTE — ED Provider Notes (Signed)
[address] EMERGENCY DEPARTMENT AT [facility] Provider Note   CSN: [id number] Arrival date & time: 11/10/23  1818     History  Chief Complaint  Patient presents with   Shortness of Breath    [name] is a 22 y.o. male.  Patient is a 22 year old male with a past medical history of asthma presenting to the emergency department with shortness of breath.  Patient states that he has been sick since Thursday with a viral syndrome.  He states initially had a sore throat and congestion however over the weekend has developed increased wheezing shortness of breath.  He states that he has been needing to use his nebulizer machine more frequently but today was not getting any relief.  He denies any fevers or chest pain.  He denies any nausea, vomiting or diarrhea.  He denies any known sick contacts.  Per EMS he was hypoxic on their arrival and was placed on 4 L nasal cannula.  He was given a neb, steroids and magnesium and route and the patient reports mild improvement.  The history is provided by the patient and a relative.  Shortness of Breath      Home Medications Prior to Admission medications   Medication Sig Start Date End Date Taking? Authorizing Provider  dicyclomine (BENTYL) 20 MG tablet Take 1 tablet (20 mg total) by mouth every 8 (eight) hours as needed for spasms. 01/07/18   Petrucelli, Pleas Koch, PA-C  guaiFENesin (MUCINEX) 600 MG 12 hr tablet Take 600 mg by mouth 2 (two) times daily as needed for cough.    [provider]  ibuprofen (ADVIL) 100 MG/5ML suspension Take 20-30 mLs (400-600 mg total) by mouth every 8 (eight) hours as needed. 10/01/19   Wieters, Hallie C, PA-C  lidocaine (XYLOCAINE) 2 % solution Use as directed 15 mLs in the mouth or throat as needed for mouth pain. 11/12/20   Rhys Martini, PA-C      Allergies    Patient has no known allergies.    Review of Systems   Review of Systems  Respiratory:  Positive for shortness of breath.      Physical Exam Updated Vital Signs BP (!) 154/95 (BP Location: Right Arm)   Pulse (!) 101   Temp (!) 97.2 F (36.2 C) (Oral)   Resp 18   Ht 6\' 2"  (1.88 m)   Wt 100.7 kg   SpO2 97%   BMI 28.50 kg/m  Physical Exam Vitals and nursing note reviewed.  Constitutional:      General: He is not in acute distress.    Appearance: He is well-developed.  HENT:     Head: Normocephalic and atraumatic.     Mouth/Throat:     Mouth: Mucous membranes are moist.  Eyes:     Extraocular Movements: Extraocular movements intact.  Cardiovascular:     Rate and Rhythm: Regular rhythm. Tachycardia present.  Pulmonary:     Effort: Pulmonary effort is normal. No accessory muscle usage.     Breath sounds: Wheezing (Diffuse inspiratory and expiratory wheeze) present.  Abdominal:     Palpations: Abdomen is soft.     Tenderness: There is no abdominal tenderness.  Musculoskeletal:        General: Normal range of motion.     Cervical back: Normal range of motion and neck supple.     Right lower leg: No edema.     Left lower leg: No edema.  Skin:    General: Skin is warm  and dry.  Neurological:     General: No focal deficit present.     Mental Status: He is alert and oriented to person, place, and time.  Psychiatric:        Mood and Affect: Mood normal.        Behavior: Behavior normal.     ED Results / Procedures / Treatments   Labs (all labs ordered are listed, but only abnormal results are displayed) Labs Reviewed  BASIC METABOLIC PANEL - Abnormal; Notable for the following components:      Result Value   Potassium 3.0 (*)    Glucose, Bld 128 (*)    All other components within normal limits  CBC WITH DIFFERENTIAL/PLATELET - Abnormal; Notable for the following components:   WBC 16.2 (*)    Neutro Abs 15.1 (*)    Lymphs Abs 0.5 (*)    All other components within normal limits  RESP PANEL BY RT-PCR (RSV, FLU A&B, COVID)  [id number]    EKG None  Radiology DG Chest Port 1 View Result  Date: 11/10/2023 CLINICAL DATA:  Shortness of breath and hypoxia. EXAM: PORTABLE CHEST 1 VIEW COMPARISON:  Radiograph 10/28/2013 FINDINGS: Costophrenic angles are excluded from the field of view. The heart is normal in size. Mediastinal contours are normal. No focal airspace disease, pleural effusion, or pneumothorax. Normal pulmonary vasculature. Unremarkable osseous structures. IMPRESSION: No acute chest findings. Electronically Signed   By: Narda Rutherford M.D.   On: 11/10/2023 19:54    Procedures Procedures    Medications Ordered in ED Medications  ipratropium-albuterol (DUONEB) 0.5-2.5 (3) MG/3ML nebulizer solution 3 mL (3 mLs Nebulization Given 11/10/23 2001)  potassium chloride SA (KLOR-CON M) CR tablet 40 mEq (40 mEq Oral Given 11/10/23 2104)  albuterol (PROVENTIL) (2.5 MG/3ML) 0.083% nebulizer solution 2.5 mg (2.5 mg Nebulization Given 11/10/23 2215)    ED Course/ Medical Decision Making/ A&P Clinical Course as of 11/10/23 2329  Sun Nov 10, 2023  2055 Leukocytosis and mild hypokalemia will be repleted. [VK]  2205 Patient reports improved SOB, still significant expiratory wheeze, mild inspiratory. O2 down-titrated to 2L and satting well. Will give albuterol neb and plan for admission for asthma exacerbation. [VK]    Clinical Course User Index [VK] Rexford Maus, DO                                 Medical Decision Making This patient presents to the ED with chief complaint(s) of shortness of breath with pertinent past medical history of asthma which further complicates the presenting complaint. The complaint involves an extensive differential diagnosis and also carries with it a high risk of complications and morbidity.    The differential diagnosis includes patient does have wheezing on exam consistent with asthma exacerbation, considering additional pneumonia, pneumothorax, pulmonary edema, pleural effusion, viral syndrome  Additional history obtained: Additional  history obtained from family Records reviewed N/A  ED Course and Reassessment: On patient's arrival he is awake and alert in no acute distress, satting well on nasal cannula.  The patient does have significant wheezing on exam.  Will be given additional DuoNebs and will have labs including viral swab and chest x-ray performed and will be closely reassessed.  Independent labs interpretation:  The following labs were independently interpreted: leukocytosis, hypokalemia, otherwise within normal range  Independent visualization of imaging: - I independently visualized the following imaging with scope of interpretation limited to determining acute life threatening  conditions related to emergency care: CXR, which revealed no acute disease  Consultation: - Consulted or discussed management/test interpretation w/ external professional: N/A  Consideration for admission or further workup: Patient has no emergent conditions requiring admission or further work-up at this time and is stable for discharge home with primary care follow-up  Social Determinants of health: N/A    Amount and/or Complexity of Data Reviewed Labs: ordered. Radiology: ordered.  Risk Prescription drug management. Decision regarding hospitalization.          Final Clinical Impression(s) / ED Diagnoses Final diagnoses:  Moderate asthma with exacerbation, unspecified whether persistent  Hypoxia    Rx / DC Orders ED Discharge Orders     None         Rexford Maus, DO 11/10/23 2329

## 2023-11-10 NOTE — H&P (Signed)
History and Physical    Patient: [name] NWG:[id number] DOB: 03-26-2001 DOA: 11/10/2023 DOS: the patient was seen and examined on 11/10/2023 PCP: [facility], [facility]  Patient coming from: Home  Chief Complaint:  Chief Complaint  Patient presents with   Shortness of Breath   HPI: [name] is a 22 y.o. male with medical history significant of atrial septal defect, and asthma who presents with increase shortness of breath.   Started to have URI symptoms of headache, congestion and sore throat a few days ago. These have improved but earlier this morning became to have increasing shortness of breath. Used albuterol neb at home up to 6 times without relieve and came to ED. Has current tobacco and marijuana use. No chest pain. No nausea, vomiting or diarrhea. No abdominal pain.   On EMS arrival he was hypoxic and placed on 4L. On arrival to ED, he was afebrile, tachycardic HR 102, elevated BP 173/109 on 4L.   CBC with leukocytosis of 16.2. No anemia.   BMP notable for hypokalemia of 3.   CXR negative. Negative flu/COVID/RSV.   He was given IV solu medrol, duoneb and Mg with EMS. Had an additional duoneb in ED and now on albuterol neb. Hospitalist consulted for continual management of asthma exacerbation.    Review of Systems: As mentioned in the history of present illness. All other systems reviewed and are negative. Past Medical History:  Diagnosis Date   Asthma, chronic, unspecified asthma severity, with acute exacerbation 11/10/2023   No past surgical history on file. Social History:  reports that he has never smoked. He has never used smokeless tobacco. He reports current alcohol use. He reports current drug use. Drug: Marijuana.  No Known Allergies  Family History  Problem Relation Age of Onset   Sickle cell anemia Father     Prior to Admission medications   Medication Sig Start Date End Date Taking? Authorizing Provider  dicyclomine (BENTYL) 20 MG tablet Take 1  tablet (20 mg total) by mouth every 8 (eight) hours as needed for spasms. 01/07/18   Petrucelli, Pleas Koch, PA-C  guaiFENesin (MUCINEX) 600 MG 12 hr tablet Take 600 mg by mouth 2 (two) times daily as needed for cough.    [provider]  ibuprofen (ADVIL) 100 MG/5ML suspension Take 20-30 mLs (400-600 mg total) by mouth every 8 (eight) hours as needed. 10/01/19   Wieters, Hallie C, PA-C  lidocaine (XYLOCAINE) 2 % solution Use as directed 15 mLs in the mouth or throat as needed for mouth pain. 11/12/20   Rhys Martini, PA-C    Physical Exam: Vitals:   11/10/23 1930 11/10/23 2216 11/10/23 2239 11/10/23 2306  BP: (!) 150/101  (!) 154/95   Pulse: 93  (!) 101   Resp: 16  18   Temp:    (!) 97.2 F (36.2 C)  TempSrc:    Oral  SpO2: 96% 91% 97%   Weight:      Height:       Constitutional: NAD, calm, comfortable, mildly ill appearing young male sitting upright in bed with hoarseness of voice Eyes:  lids and conjunctivae normal ENMT: Mucous membranes are moist.  Neck: normal, supple Respiratory: Diminished breath sounds throughout with rhonchi. Normal respiratory effort. No accessory muscle use. On 4L via [place]. Able to speak in full sentences.  Cardiovascular: Regular rate and rhythm, no murmurs / rubs / gallops. No extremity edema.  Abdomen: no tenderness, soft Musculoskeletal: no clubbing / cyanosis. No joint deformity upper  and lower extremities.  Normal muscle tone.  Skin: no rashes, lesions, ulcers. No induration Neurologic: CN 2-12 grossly intact.  Psychiatric: Normal judgment and insight. Alert and oriented x 3. Normal mood.   Data Reviewed:  See HPI  Assessment and Plan: * Acute hypoxic respiratory failure (HCC) -secondary to acute asthma exacerbation triggered by viral URI -Now requiring 4L on presentation -continue PRN xopenex nebulizer  -continue IV solu medrol daily -wean O2 as tolerated with goal O2 >92%    Tobacco use -smokes cigarettes, cigars and marijuana.  Strongly encouraged cessation   Hypokalemia -Has been administered oral potassium      Advance Care Planning: Full  Consults: none  Family Communication: girlfriend at bedside  Severity of Illness: The appropriate patient status for this patient is OBSERVATION. Observation status is judged to be reasonable and necessary in order to provide the required intensity of service to ensure the patient's safety. The patient's presenting symptoms, physical exam findings, and initial radiographic and laboratory data in the context of their medical condition is felt to place them at decreased risk for further clinical deterioration. Furthermore, it is anticipated that the patient will be medically stable for discharge from the hospital within 2 midnights of admission.   Author: Anselm Jungling, DO 11/10/2023 11:47 PM  For on call review [email or web address].

## 2023-11-10 NOTE — Assessment & Plan Note (Signed)
-  Has been administered oral potassium

## 2023-11-10 NOTE — Assessment & Plan Note (Signed)
-  smokes cigarettes, cigars and marijuana. Strongly encouraged cessation

## 2023-11-11 ENCOUNTER — Other Ambulatory Visit: Payer: Self-pay

## 2023-11-11 ENCOUNTER — Encounter (HOSPITAL_COMMUNITY): Payer: Self-pay | Admitting: Family Medicine

## 2023-11-11 DIAGNOSIS — R0902 Hypoxemia: Secondary | ICD-10-CM | POA: Diagnosis present

## 2023-11-11 DIAGNOSIS — J9601 Acute respiratory failure with hypoxia: Secondary | ICD-10-CM | POA: Diagnosis present

## 2023-11-11 DIAGNOSIS — Q211 Atrial septal defect, unspecified: Secondary | ICD-10-CM | POA: Diagnosis not present

## 2023-11-11 DIAGNOSIS — I10 Essential (primary) hypertension: Secondary | ICD-10-CM | POA: Diagnosis present

## 2023-11-11 DIAGNOSIS — J45901 Unspecified asthma with (acute) exacerbation: Secondary | ICD-10-CM | POA: Diagnosis present

## 2023-11-11 DIAGNOSIS — Z832 Family history of diseases of the blood and blood-forming organs and certain disorders involving the immune mechanism: Secondary | ICD-10-CM | POA: Diagnosis not present

## 2023-11-11 DIAGNOSIS — F17290 Nicotine dependence, other tobacco product, uncomplicated: Secondary | ICD-10-CM | POA: Diagnosis present

## 2023-11-11 DIAGNOSIS — F17210 Nicotine dependence, cigarettes, uncomplicated: Secondary | ICD-10-CM | POA: Diagnosis present

## 2023-11-11 DIAGNOSIS — Z79899 Other long term (current) drug therapy: Secondary | ICD-10-CM | POA: Diagnosis not present

## 2023-11-11 DIAGNOSIS — Z1152 Encounter for screening for COVID-19: Secondary | ICD-10-CM | POA: Diagnosis not present

## 2023-11-11 DIAGNOSIS — E876 Hypokalemia: Secondary | ICD-10-CM | POA: Diagnosis present

## 2023-11-11 DIAGNOSIS — J069 Acute upper respiratory infection, unspecified: Secondary | ICD-10-CM | POA: Diagnosis present

## 2023-11-11 DIAGNOSIS — R000 Tachycardia, unspecified: Secondary | ICD-10-CM | POA: Diagnosis present

## 2023-11-11 LAB — CBC
HCT: 48.2 % (ref 39.0–52.0)
Hemoglobin: 16.5 g/dL (ref 13.0–17.0)
MCH: 30.6 pg (ref 26.0–34.0)
MCHC: 34.2 g/dL (ref 30.0–36.0)
MCV: 89.4 fL (ref 80.0–100.0)
Platelets: 344 10*3/uL (ref 150–400)
RBC: 5.39 MIL/uL (ref 4.22–5.81)
RDW: 11.8 % (ref 11.5–15.5)
WBC: 10.2 10*3/uL (ref 4.0–10.5)
nRBC: 0 % (ref 0.0–0.2)

## 2023-11-11 LAB — HIV ANTIBODY (ROUTINE TESTING W REFLEX): HIV Screen 4th Generation wRfx: NONREACTIVE

## 2023-11-11 LAB — BASIC METABOLIC PANEL
Anion gap: 12 (ref 5–15)
BUN: 8 mg/dL (ref 6–20)
CO2: 21 mmol/L — ABNORMAL LOW (ref 22–32)
Calcium: 9.9 mg/dL (ref 8.9–10.3)
Chloride: 103 mmol/L (ref 98–111)
Creatinine, Ser: 0.89 mg/dL (ref 0.61–1.24)
GFR, Estimated: >60 mL/min (ref >60)
Glucose, Bld: 152 mg/dL — ABNORMAL HIGH (ref 70–99)
Potassium: 4.1 mmol/L (ref 3.5–5.1)
Sodium: 136 mmol/L (ref 135–145)

## 2023-11-11 MED ORDER — GUAIFENESIN ER 600 MG PO TB12
600.0000 mg | ORAL_TABLET | Freq: Two times a day (BID) | ORAL | Status: DC
  Filled 2023-11-11: qty 1

## 2023-11-11 MED ORDER — MOMETASONE FURO-FORMOTEROL FUM 200-5 MCG/ACT IN AERO: 2.0000 | INHALATION_SPRAY | Freq: Two times a day (BID) | RESPIRATORY_TRACT | 3 refills | Status: DC

## 2023-11-11 MED ORDER — AMLODIPINE BESYLATE 10 MG PO TABS
5.0000 mg | ORAL_TABLET | Freq: Every day | ORAL | Status: DC
  Administered 2023-11-11 – 2023-11-12 (×2): 5 mg via ORAL
  Filled 2023-11-11 (×2): qty 1

## 2023-11-11 MED ORDER — AMLODIPINE BESYLATE 5 MG PO TABS: 5.0000 mg | ORAL_TABLET | Freq: Every day | ORAL | 3 refills | Status: AC

## 2023-11-11 MED ORDER — DOXYCYCLINE HYCLATE 100 MG PO TABS
100.0000 mg | ORAL_TABLET | Freq: Two times a day (BID) | ORAL | Status: DC
  Administered 2023-11-11 – 2023-11-12 (×3): 100 mg via ORAL
  Filled 2023-11-11 (×3): qty 1

## 2023-11-11 MED ORDER — GUAIFENESIN 100 MG/5ML PO LIQD
15.0000 mL | Freq: Three times a day (TID) | ORAL | Status: DC
  Administered 2023-11-11 – 2023-11-12 (×3): 15 mL via ORAL
  Filled 2023-11-11 (×3): qty 20

## 2023-11-11 MED ORDER — GUAIFENESIN 100 MG/5ML PO LIQD: 15.0000 mL | Freq: Four times a day (QID) | ORAL | 0 refills | Status: AC | PRN

## 2023-11-11 MED ORDER — PREDNISONE 10 MG PO TABS: ORAL_TABLET | ORAL | 0 refills | Status: DC

## 2023-11-11 MED ORDER — LEVALBUTEROL HCL 1.25 MG/0.5ML IN NEBU
1.2500 mg | INHALATION_SOLUTION | Freq: Three times a day (TID) | RESPIRATORY_TRACT | Status: DC
  Administered 2023-11-11 – 2023-11-12 (×2): 1.25 mg via RESPIRATORY_TRACT
  Filled 2023-11-11 (×2): qty 0.5

## 2023-11-11 MED ORDER — DOXYCYCLINE HYCLATE 100 MG PO TABS: 100.0000 mg | ORAL_TABLET | Freq: Two times a day (BID) | ORAL | 0 refills | Status: AC

## 2023-11-11 MED ORDER — ENOXAPARIN SODIUM 40 MG/0.4ML IJ SOSY
40.0000 mg | PREFILLED_SYRINGE | Freq: Every day | INTRAMUSCULAR | Status: DC
Start: 2023-11-11 — End: 2023-11-12
  Administered 2023-11-11: 40 mg via SUBCUTANEOUS
  Filled 2023-11-11 (×2): qty 0.4

## 2023-11-11 MED ORDER — METHYLPREDNISOLONE SODIUM SUCC 125 MG IJ SOLR
60.0000 mg | Freq: Two times a day (BID) | INTRAMUSCULAR | Status: DC
  Administered 2023-11-11 – 2023-11-12 (×2): 60 mg via INTRAVENOUS
  Filled 2023-11-11 (×2): qty 2

## 2023-11-11 MED ORDER — LEVALBUTEROL HCL 0.63 MG/3ML IN NEBU: 0.6300 mg | INHALATION_SOLUTION | RESPIRATORY_TRACT | Status: DC | PRN

## 2023-11-11 MED ORDER — ORAL CARE MOUTH RINSE: 15.0000 mL | OROMUCOSAL | Status: DC | PRN

## 2023-11-11 MED ORDER — MOMETASONE FURO-FORMOTEROL FUM 200-5 MCG/ACT IN AERO
2.0000 | INHALATION_SPRAY | Freq: Two times a day (BID) | RESPIRATORY_TRACT | Status: DC
  Administered 2023-11-11 – 2023-11-12 (×3): 2 via RESPIRATORY_TRACT
  Filled 2023-11-11: qty 8.8

## 2023-11-11 MED ORDER — HYDRALAZINE HCL 25 MG PO TABS
25.0000 mg | ORAL_TABLET | Freq: Four times a day (QID) | ORAL | Status: DC | PRN
  Administered 2023-11-11: 25 mg via ORAL
  Filled 2023-11-11: qty 1

## 2023-11-11 NOTE — Progress Notes (Signed)
MEWS Progress Note  Patient Details Name: [name] MRN: [id number] DOB: 09-Aug-2001 Today's Date: 11/11/2023   MEWS Flowsheet Documentation:  Assess: MEWS Score Temp: 98.2 F (36.8 C) BP: (!) 166/102 MAP (mmHg): 117 Pulse Rate: (!) 101 ECG Heart Rate: 94 Resp: (!) 24 Level of Consciousness: Alert SpO2: 98 % O2 Device: Room Air Assess: MEWS Score MEWS Temp: 0 MEWS Systolic: 0 MEWS Pulse: 1 MEWS RR: 1 MEWS LOC: 0 MEWS Score: 2 MEWS Score Color: Yellow Assess: SIRS CRITERIA SIRS Temperature : 0 SIRS Respirations : 1 SIRS Pulse: 1 SIRS WBC: 0 SIRS Score Sum : 2 SIRS Temperature : 0 SIRS Pulse: 1 SIRS Respirations : 1 SIRS WBC: 1 SIRS Score Sum : 3 Assess: if the MEWS score is Yellow or Red Were vital signs accurate and taken at a resting state?: Yes Does the patient meet 2 or more of the SIRS criteria?: Yes Does the patient have a confirmed or suspected source of infection?: No MEWS guidelines implemented : Yes, yellow Treat MEWS Interventions: Considered administering scheduled or prn medications/treatments as ordered Take Vital Signs Increase Vital Sign Frequency : Yellow: Q2hr x1, continue Q4hrs until patient remains green for 12hrs Escalate MEWS: Escalate: Yellow: Discuss with charge nurse and consider notifying provider and/or RRT Notify: Charge Nurse/RN Name of Charge Nurse/RN Notified: Lauren H.,RN  Received patient from ED via stretcher, accompanied by RN and significant other. Patient arlet, no complains of pain and no s/s of respiratory distress. Bed locked and in low position, call light given and within reach.  Comfort measures provided.   [name] 11/11/2023, 12:56 AM

## 2023-11-11 NOTE — Progress Notes (Signed)
[facility]  PROGRESS NOTE  [name] WUJ:[id number] DOB: [birth year] DOA: 11/10/2023 PCP: [facility]   Brief HPI:    22 y.o. male with medical history significant of atrial septal defect, and asthma who presents with increase shortness of breath.     Started to have URI symptoms of headache, congestion and sore throat a few days ago. These have improved but earlier this morning became to have increasing shortness of breath. Used albuterol neb at home up to 6 times without relieve and came to ED. Has current tobacco and marijuana use. No chest pain. No nausea, vomiting or diarrhea. No abdominal pain.    On EMS arrival he was hypoxic and placed on 4L. On arrival to ED, he was afebrile, tachycardic HR 102, elevated BP 173/109 on 4L.     Assessment/Plan:   Acute hypoxemic respiratory failure -Secondary to asthma exacerbation -Resolved, no longer requiring oxygen -Still has wheezing bilaterally  Asthma exacerbation -Continue Solu-Medrol 60 mg IV every 12 hours -Start Dulera 2 puffs twice daily -Start doxycycline 100 mg p.o. twice daily -Start Mucinex 300 mg every 6 hours -Continue Xopenex nebulizer 3 times daily  Hypertension -Blood pressure has been elevated -Start amlodipine 5 mg daily -Start hydralazine as needed   Medications     amLODipine  5 mg Oral Daily   doxycycline  100 mg Oral Q12H   guaiFENesin  15 mL Oral Q8H   levalbuterol  1.25 mg Nebulization TID   methylPREDNISolone (SOLU-MEDROL) injection  60 mg Intravenous Daily   mometasone-formoterol  2 puff Inhalation BID     Data Reviewed:   CBG:  No results for input(s): "GLUCAP" in the last 168 hours.  SpO2: 97 % FiO2 (%): 21 %    Vitals:   11/11/23 0608 11/11/23 1010 11/11/23 1437 11/11/23 1604  BP: (!) 165/98 (!) 158/89  (!) 162/106  Pulse: (!) 102 (!) 103  98  Resp: 20 18  16   Temp: 98.9 F (37.2 C) 98.3 F (36.8 C)  97.8 F (36.6 C)  TempSrc: Oral Axillary  Oral  SpO2: 93% 95%  96% 97%  Weight:      Height:          Data Reviewed:  Basic Metabolic Panel: Recent Labs  Lab 11/10/23 1903 11/11/23 0350  NA 137 136  K 3.0* 4.1  CL 104 103  CO2 23 21*  GLUCOSE 128* 152*  BUN 7 8  CREATININE 0.78 0.89  CALCIUM 9.1 9.9    CBC: Recent Labs  Lab 11/10/23 1903 11/11/23 0350  WBC 16.2* 10.2  NEUTROABS 15.1*  --   HGB 15.4 16.5  HCT 43.5 48.2  MCV 86.5 89.4  PLT 311 344    LFT No results for input(s): "AST", "ALT", "ALKPHOS", "BILITOT", "PROT", "ALBUMIN" in the last 168 hours.   Antibiotics: Anti-infectives (From admission, onward)    Start     Dose/Rate Route Frequency Ordered Stop   11/11/23 1430  doxycycline (VIBRA-TABS) tablet 100 mg        100 mg Oral Every 12 hours 11/11/23 1344     11/11/23 0000  doxycycline (VIBRA-TABS) 100 MG tablet        100 mg Oral Every 12 hours 11/11/23 1616          DVT prophylaxis: Lovenox  Code Status: Full code  Family Communication: Discussed with family member at bedside   CONSULTS    Subjective   Breathing has improved, still wheezing   Objective  Physical Examination:   General-appears in no acute distress Heart-S1-S2, regular, no murmur auscultated Lungs-bilateral wheezing auscultated Abdomen-soft, nontender, no organomegaly Extremities-no edema in the lower extremities Neuro-alert, oriented x3, no focal deficit noted  Status is: Inpatient:             Hellen Shanley S Jerald Hennington   [facility] If 7PM-7AM, please contact night-coverage at [email or web address], Office  [phone]   11/11/2023, 4:51 PM  LOS: 0 days

## 2023-11-11 NOTE — TOC CM/SW Note (Signed)
Transition of Care [facility]) - Inpatient Brief Assessment   Patient Details  Name: [name] MRN: [id number] Date of Birth: 2000/12/30  Transition of Care [facility]) CM/SW Contact:    Larrie Kass, LCSW Phone Number: 11/11/2023, 1:32 PM   Clinical Narrative:  Transition of Care Department [facility]) has reviewed patient and no TOC needs have been identified at this time. We will continue to monitor patient advancement through interdisciplinary progression rounds. If new patient transition needs arise, please place a TOC consult.  Transition of Care Asessment: Insurance and Status: Insurance coverage has been reviewed   Home environment has been reviewed: home with self Prior level of function:: independent Prior/Current Home Services: No current home services Social Drivers of Health Review: SDOH reviewed no interventions necessary Readmission risk has been reviewed: Yes Transition of care needs: no transition of care needs at this time

## 2023-11-12 ENCOUNTER — Other Ambulatory Visit (HOSPITAL_COMMUNITY): Payer: Self-pay

## 2023-11-12 ENCOUNTER — Telehealth (HOSPITAL_BASED_OUTPATIENT_CLINIC_OR_DEPARTMENT_OTHER): Payer: Self-pay | Admitting: Pulmonary Disease

## 2023-11-12 NOTE — Telephone Encounter (Signed)
 PCCM Note  Contacted by hospitalist team to arrange pulmonary outpatient consult for asthma. Hospitalized for asthma from 11/10/23-11/12/23. Discharged with [facility] and albuterol.  Schedule on 2/12 with Dr Vassie Loll

## 2023-11-12 NOTE — Progress Notes (Signed)
AVS reviewed w/ pt & family in room- both verbalized an understanding. PIV removed as noted. Pt stated he stayed overnight due to wheezing last night. Pt dressing for d/c to home

## 2023-11-15 ENCOUNTER — Ambulatory Visit
Admission: EM | Admit: 2023-11-15 | Discharge: 2023-11-15 | Disposition: A | Discharge status: Home / Self Care | Payer: Medicaid Other | Attending: Family Medicine | Admitting: Family Medicine

## 2023-11-15 ENCOUNTER — Ambulatory Visit: Payer: Self-pay

## 2023-11-15 ENCOUNTER — Ambulatory Visit (INDEPENDENT_AMBULATORY_CARE_PROVIDER_SITE_OTHER): Payer: Medicaid Other

## 2023-11-15 DIAGNOSIS — J4541 Moderate persistent asthma with (acute) exacerbation: Secondary | ICD-10-CM | POA: Diagnosis not present

## 2023-11-15 MED ORDER — METHYLPREDNISOLONE ACETATE 80 MG/ML IJ SUSP
80.0000 mg | Freq: Once | INTRAMUSCULAR | Status: AC
  Administered 2023-11-15: 80 mg via INTRAMUSCULAR

## 2023-11-15 MED ORDER — ALBUTEROL SULFATE HFA 108 (90 BASE) MCG/ACT IN AERS: 1.0000 | INHALATION_SPRAY | Freq: Four times a day (QID) | RESPIRATORY_TRACT | 0 refills | Status: AC | PRN

## 2023-11-15 NOTE — ED Triage Notes (Signed)
 Pt presents with c/o chest pain since this morning. Pt states he feels tired.   Denies dizziness and headaches

## 2023-11-15 NOTE — ED Provider Notes (Signed)
 [facility]  Note:  This document was prepared using Conservation officer, historic buildings and may include unintentional dictation errors.  MRN: [id number] DOB: [birth year]  Subjective:   [name] is a 23 y.o. male presenting for acute onset of chest pain this morning.  Patient was recently admitted to the hospital for acute hypoxic respiratory failure, moderate to severe chronic asthma.  He is currently taking doxycycline .  Chest x-ray was negative for pneumonia from his admission 11/10/2023.  Needs a refill his albuterol .  Has been using his Dulera .  No current facility-administered medications for this encounter.  Current Outpatient Medications:    amLODipine  (NORVASC ) 5 MG tablet, Take 1 tablet (5 mg total) by mouth daily., Disp: 30 tablet, Rfl: 3   doxycycline  (VIBRA -TABS) 100 MG tablet, Take 1 tablet (100 mg total) by mouth every 12 (twelve) hours., Disp: 10 tablet, Rfl: 0   guaiFENesin  (ROBITUSSIN) 100 MG/5ML liquid, Take 15 mLs by mouth every 6 (six) hours as needed for cough or to loosen phlegm., Disp: 120 mL, Rfl: 0   mometasone -formoterol  (DULERA ) 200-5 MCG/ACT AERO, Inhale 2 puffs into the lungs 2 (two) times daily., Disp: 1 each, Rfl: 3   predniSONE  (DELTASONE ) 10 MG tablet, Prednisone  40 mg po daily x 1 day then Prednisone  30 mg po daily x 1 day then Prednisone  20 mg po daily x 1 day then Prednisone  10 mg daily x 1 day then stop..., Disp: 10 tablet, Rfl: 0   Allergies  Allergen Reactions   Shellfish Allergy Swelling    Past Medical History:  Diagnosis Date   Asthma, chronic, unspecified asthma severity, with acute exacerbation 11/10/2023     History reviewed. No pertinent surgical history.  Family History  Problem Relation Age of Onset   Sickle cell anemia Father     Social History   Tobacco Use   Smoking status: Never   Smokeless tobacco: Never  Substance Use Topics   Alcohol use: Yes    Comment: occ   Drug use: Yes    Types:  Marijuana    ROS   Objective:   Vitals: BP (!) 144/85 (BP Location: Right Arm)   Pulse 64   Temp 98.2 F (36.8 C) (Oral)   Resp 17   SpO2 94%   Physical Exam Constitutional:      General: He is not in acute distress.    Appearance: Normal appearance. He is well-developed. He is not ill-appearing, toxic-appearing or diaphoretic.  HENT:     Head: Normocephalic and atraumatic.     Right Ear: External ear normal.     Left Ear: External ear normal.     Nose: Nose normal.     Mouth/Throat:     Mouth: Mucous membranes are moist.  Eyes:     General: No scleral icterus.       Right eye: No discharge.        Left eye: No discharge.     Extraocular Movements: Extraocular movements intact.  Cardiovascular:     Rate and Rhythm: Normal rate and regular rhythm.     Heart sounds: Normal heart sounds. No murmur heard.    No friction rub. No gallop.  Pulmonary:     Effort: Pulmonary effort is normal. No respiratory distress.     Breath sounds: No stridor. Wheezing and rhonchi present. No rales.  Neurological:     Mental Status: He is alert and oriented to person, place, and time.  Psychiatric:  Mood and Affect: Mood normal.        Behavior: Behavior normal.        Thought Content: Thought content normal.    IM Depo-Medrol  80 mg administered in clinic.  Assessment and Plan :   PDMP not reviewed this encounter.  1. Moderate persistent asthma with (acute) exacerbation    IM steroids as above.  Maintain doxycycline . X-ray over-read was pending at time of discharge, recommended follow up with only abnormal results. Otherwise will not call for negative over-read. Patient was in agreement. Counseled patient on potential for adverse effects with medications prescribed/recommended today, ER and return-to-clinic precautions discussed, patient verbalized understanding.    Christopher Savannah, PA-C 11/15/23 1329

## 2023-11-15 NOTE — Telephone Encounter (Signed)
     Chief Complaint: D/C from hospital 11/12/23 with asthma. Having chest pain today.Had same pain in hospital Symptoms: Above Frequency: Today Pertinent Negatives: Patient denies SOB Disposition: [x] ED /[] Urgent Care (no appt availability in office) / [] Appointment(In office/virtual)/ []  [facility]/ [] Home Care/ [] Refused Recommended Disposition /[] [facility]/ [x]  Follow-up with PCP Additional Notes: Will call PCP and go to ED if he cannot be seen.  Reason for Disposition  Patient sounds very sick or weak to the triager  Answer Assessment - Initial Assessment Questions 1. LOCATION: Where does it hurt?       Left side 2. RADIATION: Does the pain go anywhere else? (e.g., into neck, jaw, arms, back)     No 3. ONSET: When did the chest pain begin? (Minutes, hours or days)      Hours 4. PATTERN: Does the pain come and go, or has it been constant since it started?  Does it get worse with exertion?      Constant 5. DURATION: How long does it last (e.g., seconds, minutes, hours)     Hours 6. SEVERITY: How bad is the pain?  (e.g., Scale 1-10; mild, moderate, or severe)    - MILD (1-3): doesn't interfere with normal activities     - MODERATE (4-7): interferes with normal activities or awakens from sleep    - SEVERE (8-10): excruciating pain, unable to do any normal activities       5 7. CARDIAC RISK FACTORS: Do you have any history of heart problems or risk factors for heart disease? (e.g., angina, prior heart attack; diabetes, high blood pressure, high cholesterol, smoker, or strong family history of heart disease)     No 8. PULMONARY RISK FACTORS: Do you have any history of lung disease?  (e.g., blood clots in lung, asthma, emphysema, birth control pills)     Asthma 9. CAUSE: What do you think is causing the chest pain?     Unsure 10. OTHER SYMPTOMS: Do you have any other symptoms? (e.g., dizziness, nausea, vomiting, sweating, fever,  difficulty breathing, cough)       No 11. PREGNANCY: Is there any chance you are pregnant? When was your last menstrual period?       N/a  Protocols used: Chest Pain-A-AH

## 2023-12-25 ENCOUNTER — Inpatient Hospital Stay (HOSPITAL_BASED_OUTPATIENT_CLINIC_OR_DEPARTMENT_OTHER): Payer: Medicaid Other | Admitting: Pulmonary Disease

## 2023-12-25 ENCOUNTER — Encounter (HOSPITAL_BASED_OUTPATIENT_CLINIC_OR_DEPARTMENT_OTHER): Payer: Self-pay | Admitting: Pulmonary Disease

## 2024-11-09 ENCOUNTER — Emergency Department (HOSPITAL_COMMUNITY)

## 2024-11-09 ENCOUNTER — Other Ambulatory Visit: Payer: Self-pay

## 2024-11-09 ENCOUNTER — Emergency Department (HOSPITAL_COMMUNITY)
Admission: EM | Admit: 2024-11-09 | Discharge: 2024-11-09 | Disposition: A | Discharge status: Home / Self Care | Attending: Emergency Medicine | Admitting: Emergency Medicine

## 2024-11-09 ENCOUNTER — Encounter (HOSPITAL_COMMUNITY): Payer: Self-pay

## 2024-11-09 DIAGNOSIS — R000 Tachycardia, unspecified: Secondary | ICD-10-CM | POA: Insufficient documentation

## 2024-11-09 DIAGNOSIS — R059 Cough, unspecified: Secondary | ICD-10-CM | POA: Diagnosis present

## 2024-11-09 DIAGNOSIS — J4541 Moderate persistent asthma with (acute) exacerbation: Secondary | ICD-10-CM | POA: Insufficient documentation

## 2024-11-09 DIAGNOSIS — J111 Influenza due to unidentified influenza virus with other respiratory manifestations: Secondary | ICD-10-CM

## 2024-11-09 MED ORDER — ACETAMINOPHEN 500 MG PO TABS: 1000.0000 mg | ORAL_TABLET | Freq: Once | ORAL | Status: DC

## 2024-11-09 MED ORDER — ACETAMINOPHEN 500 MG PO TABS
1000.0000 mg | ORAL_TABLET | Freq: Once | ORAL | Status: AC
  Administered 2024-11-09: 1000 mg via ORAL
  Filled 2024-11-09: qty 2

## 2024-11-09 MED ORDER — IPRATROPIUM-ALBUTEROL 0.5-2.5 (3) MG/3ML IN SOLN
3.0000 mL | Freq: Once | RESPIRATORY_TRACT | Status: AC
  Administered 2024-11-09: 3 mL via RESPIRATORY_TRACT
  Filled 2024-11-09: qty 3

## 2024-11-09 MED ORDER — ALBUTEROL SULFATE HFA 108 (90 BASE) MCG/ACT IN AERS
2.0000 | INHALATION_SPRAY | RESPIRATORY_TRACT | Status: DC | PRN
  Filled 2024-11-09: qty 6.7

## 2024-11-09 MED ORDER — ALBUTEROL SULFATE (2.5 MG/3ML) 0.083% IN NEBU: 2.5000 mg | INHALATION_SOLUTION | Freq: Four times a day (QID) | RESPIRATORY_TRACT | 1 refills | Status: AC | PRN

## 2024-11-09 MED ORDER — IPRATROPIUM-ALBUTEROL 0.5-2.5 (3) MG/3ML IN SOLN
RESPIRATORY_TRACT | Status: AC
  Filled 2024-11-09: qty 3

## 2024-11-09 MED ORDER — MOMETASONE FURO-FORMOTEROL FUM 200-5 MCG/ACT IN AERO: 2.0000 | INHALATION_SPRAY | Freq: Two times a day (BID) | RESPIRATORY_TRACT | 3 refills | Status: AC

## 2024-11-09 MED ORDER — PREDNISONE 20 MG PO TABS
60.0000 mg | ORAL_TABLET | Freq: Once | ORAL | Status: AC
  Administered 2024-11-09: 60 mg via ORAL
  Filled 2024-11-09: qty 3

## 2024-11-09 MED ORDER — PREDNISONE 20 MG PO TABS: 40.0000 mg | ORAL_TABLET | Freq: Every day | ORAL | 0 refills | Status: AC

## 2024-11-09 NOTE — ED Triage Notes (Addendum)
 Non productive cough, headache, fever, chills, body aches since last night. Pt has used home albuterol  nebs and had relief with them, PCP called in another albuterol  inhaler. Pt requests RX for dulera 

## 2024-11-09 NOTE — Discharge Instructions (Signed)
 Make sure you are drinking plenty of fluids and take Tylenol  and ibuprofen  to keep your temperature down.  You will take your next dose of steroids tomorrow and in the meantime use your inhaler as needed every 4-6 hours.  We also refilled your Dulera  and you will use that daily as usual.  If you feel like your breathing is getting worse and despite the medications you are not able to catch your breath return to the emergency room.

## 2024-11-09 NOTE — ED Provider Notes (Signed)
 " [facility] AT [facility] Provider Note   CSN: [id number] Arrival date & time: 11/09/24  9190     Patient presents with: Cough   [name] is a 23 y.o. male.   Patient is a 23 year old with a history of asthma who is presenting today with 24 hours of cough, congestion, myalgias, malaise.  He reports that he started feeling short of breath with increased wheezing as well since last night.  He has tried using his inhaler and nebulizer at home but it was not helping.  He denies any productive cough.  No nausea or vomiting.  The history is provided by the patient and medical records.  Cough      Prior to Admission medications  Medication Sig Start Date End Date Taking? Authorizing Provider  albuterol  (PROVENTIL ) (2.5 MG/3ML) 0.083% nebulizer solution Take 3 mLs (2.5 mg total) by nebulization every 6 (six) hours as needed for wheezing or shortness of breath. 11/09/24  Yes Doretha Folks, MD  predniSONE  (DELTASONE ) 20 MG tablet Take 2 tablets (40 mg total) by mouth daily. 11/09/24  Yes Doretha Folks, MD  albuterol  (VENTOLIN  HFA) 108 (90 Base) MCG/ACT inhaler Inhale 1-2 puffs into the lungs every 6 (six) hours as needed for wheezing or shortness of breath. 11/15/23   Christopher Savannah, PA-C  amLODipine  (NORVASC ) 5 MG tablet Take 1 tablet (5 mg total) by mouth daily. 11/12/23   Drusilla Sabas RAMAN, MD  doxycycline  (VIBRA -TABS) 100 MG tablet Take 1 tablet (100 mg total) by mouth every 12 (twelve) hours. 11/11/23   Drusilla Sabas RAMAN, MD  guaiFENesin  (ROBITUSSIN) 100 MG/5ML liquid Take 15 mLs by mouth every 6 (six) hours as needed for cough or to loosen phlegm. 11/11/23   Drusilla Sabas RAMAN, MD  mometasone -formoterol  (DULERA ) 200-5 MCG/ACT AERO Inhale 2 puffs into the lungs 2 (two) times daily. 11/09/24   Doretha Folks, MD    Allergies: Shellfish allergy    Review of Systems  Respiratory:  Positive for cough.     Updated Vital Signs BP (!) 150/99   Pulse (!)  119   Temp 100 F (37.8 C) (Oral)   Resp 20   Ht 6' 2 (1.88 m)   Wt 97.5 kg   SpO2 99%   BMI 27.60 kg/m   Physical Exam Constitutional:      General: He is not in acute distress.    Appearance: He is well-developed.  HENT:     Head: Normocephalic and atraumatic.     Right Ear: Tympanic membrane and external ear normal.     Left Ear: Tympanic membrane and external ear normal.     Nose: Nose normal.     Mouth/Throat:     Mouth: Mucous membranes are moist.  Eyes:     General:        Right eye: No discharge.     Conjunctiva/sclera: Conjunctivae normal.     Pupils: Pupils are equal, round, and reactive to light.  Cardiovascular:     Rate and Rhythm: Regular rhythm. Tachycardia present.     Heart sounds: Normal heart sounds. No murmur heard. Pulmonary:     Effort: Pulmonary effort is normal. Tachypnea present. No respiratory distress.     Breath sounds: Wheezing present. No decreased breath sounds, rhonchi or rales.  Abdominal:     Palpations: Abdomen is soft.     Tenderness: There is no abdominal tenderness.  Musculoskeletal:        General: No tenderness. Normal range of  motion.     Cervical back: Normal range of motion and neck supple.  Skin:    General: Skin is warm and dry.     Findings: No rash.  Neurological:     Mental Status: He is alert and oriented to person, place, and time.     (all labs ordered are listed, but only abnormal results are displayed) Labs Reviewed - No data to display  EKG: None  Radiology: [facility] Chest Port 1 View Result Date: 11/09/2024 EXAM: 1 VIEW(S) XRAY OF THE CHEST 11/09/2024 09:05:00 AM COMPARISON: 11/15/2023 CLINICAL HISTORY: cough and sob FINDINGS: LUNGS AND PLEURA: No focal pulmonary opacity. No pleural effusion. No pneumothorax. HEART AND MEDIASTINUM: No acute abnormality of the cardiac and mediastinal silhouettes. BONES AND SOFT TISSUES: No acute osseous abnormality. IMPRESSION: 1. No acute cardiopulmonary abnormality.  Electronically signed by: Michaeline Blanch MD 11/09/2024 10:06 AM EST RP Workstation: [id number]     Procedures   Medications Ordered in the ED  albuterol  (VENTOLIN  HFA) 108 (90 Base) MCG/ACT inhaler 2 puff (has no administration in time range)  acetaminophen  (TYLENOL ) tablet 1,000 mg (1,000 mg Oral Given 11/09/24 0821)  ipratropium-albuterol  (DUONEB) 0.5-2.5 (3) MG/3ML nebulizer solution 3 mL (3 mLs Nebulization Not Given 11/09/24 1013)  predniSONE  (DELTASONE ) tablet 60 mg (60 mg Oral Given 11/09/24 0947)                                    Medical Decision Making Amount and/or Complexity of Data Reviewed Radiology: ordered and independent interpretation performed. Decision-making details documented in ED Course.  Risk OTC drugs. Prescription drug management.   Pt presenting today with a complaint that caries a high risk for morbidity and mortality. Patient is presenting with flulike illness that started yesterday now with asthma exacerbation as well.  Patient's temperature is 100 with some tachycardia.  He is wheezing diffusely but not in respiratory distress at this time.  Will give DuoNebs and prednisone .  Chest x-ray pending.  10:59 AM I have independently visualized and interpreted pt's images today.  Chest x-ray is within normal limits.  On repeat evaluation patient now has scant wheezing and reports breathing is better.  Will discharge home with steroid burst and patient was given a new Dulera  inhaler and albuterol  inhaler.      Final diagnoses:  Influenza-like illness  Moderate persistent asthma with exacerbation    ED Discharge Orders          Ordered    mometasone -formoterol  (DULERA ) 200-5 MCG/ACT AERO  2 times daily        11/09/24 1057    predniSONE  (DELTASONE ) 20 MG tablet  Daily        11/09/24 1057    albuterol  (PROVENTIL ) (2.5 MG/3ML) 0.083% nebulizer solution  Every 6 hours PRN        11/09/24 1057               Doretha Folks, MD 11/09/24  1059  "
